# Patient Record
Sex: Female | Born: 2000 | Race: Black or African American | Hispanic: No | Marital: Single | Smoking: Never smoker
Health system: Southern US, Community
[De-identification: ages and names within clinical notes are randomized; demographics above are authoritative.]

## PROBLEM LIST (undated history)

## (undated) DIAGNOSIS — J45909 Unspecified asthma, uncomplicated: Secondary | ICD-10-CM

## (undated) DIAGNOSIS — N62 Hypertrophy of breast: Secondary | ICD-10-CM

## (undated) HISTORY — PX: WISDOM TOOTH EXTRACTION: SHX21

## (undated) HISTORY — PX: TONSILLECTOMY: SUR1361

---

## 2020-12-18 ENCOUNTER — Ambulatory Visit (INDEPENDENT_AMBULATORY_CARE_PROVIDER_SITE_OTHER): Payer: Medicaid Other

## 2020-12-18 ENCOUNTER — Telehealth: Payer: Self-pay | Admitting: Emergency Medicine

## 2020-12-18 ENCOUNTER — Ambulatory Visit
Admission: RE | Admit: 2020-12-18 | Discharge: 2020-12-18 | Disposition: A | Discharge status: Home / Self Care | Payer: Medicaid Other | Source: Ambulatory Visit

## 2020-12-18 ENCOUNTER — Other Ambulatory Visit: Payer: Self-pay

## 2020-12-18 VITALS — BP 114/78 | HR 101 | Temp 98.9°F | Resp 16

## 2020-12-18 DIAGNOSIS — M25531 Pain in right wrist: Secondary | ICD-10-CM

## 2020-12-18 DIAGNOSIS — S63501A Unspecified sprain of right wrist, initial encounter: Secondary | ICD-10-CM

## 2020-12-18 DIAGNOSIS — W19XXXA Unspecified fall, initial encounter: Secondary | ICD-10-CM | POA: Diagnosis not present

## 2020-12-18 MED ORDER — IBUPROFEN 600 MG PO TABS: 600.0000 mg | ORAL_TABLET | Freq: Four times a day (QID) | ORAL | 0 refills | Status: DC | PRN

## 2020-12-18 NOTE — ED Provider Notes (Signed)
[facility]    CSN: [id number] Arrival date & time: 12/18/20  1445      History   Chief Complaint Chief Complaint  Patient presents with  . Appointment    3:00  . Wrist Pain    HPI Rita Ross is a 20 y.o. female presenting today for evaluation of wrist pain.  Reports pain began 1 day ago.  Reports falling onto right wrist and since has had pain with movement.  Denies any numbness or tingling.  Denies prior fracture. HPI  History reviewed. No pertinent past medical history.  There are no problems to display for this patient.   History reviewed. No pertinent surgical history.  OB History   No obstetric history on file.      Home Medications    Prior to Admission medications   Medication Sig Start Date End Date Taking? Authorizing Provider  ibuprofen (ADVIL) 600 MG tablet Take 1 tablet (600 mg total) by mouth every 6 (six) hours as needed. 12/18/20  Yes Jairon Ripberger C, PA-C  SIMPESSE 0.15-0.03 &0.01 MG tablet Take 1 tablet by mouth daily. 12/04/20  Yes [provider]    Family History Family History  Problem Relation Age of Onset  . Healthy Mother   . Healthy Father     Social History Social History   Tobacco Use  . Smoking status: Never Smoker  . Smokeless tobacco: Never Used  Substance Use Topics  . Alcohol use: Never     Allergies   Amoxicillin   Review of Systems Review of Systems  Constitutional: Negative for fatigue and fever.  Eyes: Negative for visual disturbance.  Respiratory: Negative for shortness of breath.   Cardiovascular: Negative for chest pain.  Gastrointestinal: Negative for abdominal pain, nausea and vomiting.  Musculoskeletal: Positive for arthralgias and joint swelling.  Skin: Negative for color change, rash and wound.  Neurological: Negative for dizziness, weakness, light-headedness and headaches.     Physical Exam Triage Vital Signs ED Triage Vitals [12/18/20 1510]  Enc Vitals Group      BP      Pulse      Resp      Temp      Temp src      SpO2      Weight      Height      Head Circumference      Peak Flow      Pain Score 7     Pain Loc      Pain Edu?      Excl. in GC?    No data found.  Updated Vital Signs BP 114/78 (BP Location: Right Arm)   Pulse (!) 101   Temp 98.9 F (37.2 C) (Oral)   Resp 16   LMP 10/27/2020   SpO2 97%   Visual Acuity Right Eye Distance:   Left Eye Distance:   Bilateral Distance:    Right Eye Near:   Left Eye Near:    Bilateral Near:     Physical Exam Vitals and nursing note reviewed.  Constitutional:      Appearance: She is well-developed.     Comments: No acute distress  HENT:     Head: Normocephalic and atraumatic.     Nose: Nose normal.  Eyes:     Conjunctiva/sclera: Conjunctivae normal.  Cardiovascular:     Rate and Rhythm: Normal rate.  Pulmonary:     Effort: Pulmonary effort is normal. No respiratory distress.  Abdominal:  General: There is no distension.  Musculoskeletal:        General: Normal range of motion.     Cervical back: Neck supple.     Comments: Right wrist: No obvious deformity swelling or discoloration, tenderness to palpation of distal radius, nontender to distal ulna, no snuffbox tenderness, mild tenderness along first metacarpal, nontender throughout large portion of the dorsum of hand, radial pulse 2+ Full active range of motion of wrist/hand  Skin:    General: Skin is warm and dry.  Neurological:     Mental Status: She is alert and oriented to person, place, and time.      UC Treatments / Results  Labs (all labs ordered are listed, but only abnormal results are displayed) Labs Reviewed - No data to display  EKG   Radiology DG Wrist Complete Right  Result Date: 12/18/2020 CLINICAL DATA:  Right wrist pain after fall 2 days ago. EXAM: RIGHT WRIST - COMPLETE 3+ VIEW COMPARISON:  None. FINDINGS: There is no evidence of fracture or dislocation. There is no evidence of  arthropathy or other focal bone abnormality. Soft tissues are unremarkable. IMPRESSION: Negative. Electronically Signed   By: Lupita Raider M.D.   On: 12/18/2020 15:52    Procedures Procedures (including critical care time)  Medications Ordered in UC Medications - No data to display  Initial Impression / Assessment and Plan / UC Course  I have reviewed the triage vital signs and the nursing notes.  Pertinent labs & imaging results that were available during my care of the patient were reviewed by me and considered in my medical decision making (see chart for details).     X-ray negative for acute bony abnormality, no snuffbox tenderness, low suspicion of scaphoid injury, placing in wrist brace and recommending anti-inflammatories rest and ice.  Would expect spontaneous gradual resolution.  Continue to monitor,Discussed strict return precautions. Patient verbalized understanding and is agreeable with plan.  Final Clinical Impressions(s) / UC Diagnoses   Final diagnoses:  Sprain of right wrist, initial encounter     Discharge Instructions     Use anti-inflammatories for pain/swelling. You may take up to 800 mg Ibuprofen every 8 hours with food. You may supplement Ibuprofen with Tylenol [phone] mg every 8 hours.  Ice and rest wrist Wear wrist brace Follow-up if not improving or worsening    ED Prescriptions    Medication Sig Dispense Auth. Provider   ibuprofen (ADVIL) 600 MG tablet Take 1 tablet (600 mg total) by mouth every 6 (six) hours as needed. 30 tablet Antar Milks, Edmund C, PA-C     PDMP not reviewed this encounter.   Lew Dawes, PA-C 12/18/20 1606

## 2020-12-18 NOTE — ED Triage Notes (Signed)
Patient presents to Urgent Care with complaints of right wrist pain since 1 day ago. Patient reports falling onto the right wrist. Having pain with movement. Right hand dominant

## 2020-12-18 NOTE — Discharge Instructions (Signed)
Use anti-inflammatories for pain/swelling. You may take up to 800 mg Ibuprofen every 8 hours with food. You may supplement Ibuprofen with Tylenol [phone] mg every 8 hours.  Ice and rest wrist Wear wrist brace Follow-up if not improving or worsening

## 2021-06-21 ENCOUNTER — Ambulatory Visit
Admission: EM | Admit: 2021-06-21 | Discharge: 2021-06-21 | Disposition: A | Discharge status: Home / Self Care | Payer: Commercial Managed Care - PPO | Attending: Internal Medicine | Admitting: Internal Medicine

## 2021-06-21 ENCOUNTER — Other Ambulatory Visit: Payer: Self-pay

## 2021-06-21 ENCOUNTER — Encounter: Payer: Self-pay | Admitting: Emergency Medicine

## 2021-06-21 DIAGNOSIS — L03213 Periorbital cellulitis: Secondary | ICD-10-CM | POA: Diagnosis not present

## 2021-06-21 MED ORDER — CEFDINIR 300 MG PO CAPS: 300.0000 mg | ORAL_CAPSULE | Freq: Two times a day (BID) | ORAL | 0 refills | Status: AC

## 2021-06-21 NOTE — Discharge Instructions (Addendum)
You have preseptal cellulitis.  This is treated with cefdinir antibiotic.  This medication has been sent to your pharmacy.  Please follow-up if symptoms persist or do not improve.

## 2021-06-21 NOTE — ED Triage Notes (Signed)
Patient c/o right eyelid swelling x 2 days, no injury, some swelling.  Drainage from eye in the am.

## 2021-06-21 NOTE — ED Provider Notes (Signed)
[facility]    CSN: [id number] Arrival date & time: 06/21/21  1257      History   Chief Complaint Chief Complaint  Patient presents with   Belepharitis    HPI Rita Ross is a 20 y.o. female.   Patient presents with right eyelid swelling that has been present for approximately 2 days.  Denies any pain to eye but states that it is "irritating".  Denies any itchiness or drainage from the eye.  Denies any trauma to the eye or any foreign bodies.  Does endorse some mild blurry vision.  Patient was seen by PCP with telemetry visit yesterday and was diagnosed with hordeolum and prescribed erythromycin ointment.  Patient has not yet been taking erythromycin ointment.Denies pain with EOM.     History reviewed. No pertinent past medical history.  There are no problems to display for this patient.   History reviewed. No pertinent surgical history.  OB History   No obstetric history on file.      Home Medications    Prior to Admission medications   Medication Sig Start Date End Date Taking? Authorizing Provider  cefdinir (OMNICEF) 300 MG capsule Take 1 capsule (300 mg total) by mouth 2 (two) times daily for 10 days. 06/21/21 07/01/21 Yes Lance Muss, FNP  SIMPESSE 0.15-0.03 &0.01 MG tablet Take 1 tablet by mouth daily. 12/04/20  Yes [provider]  ibuprofen (ADVIL) 600 MG tablet Take 1 tablet (600 mg total) by mouth every 6 (six) hours as needed. 12/18/20   Wieters, Junius Creamer, PA-C    Family History Family History  Problem Relation Age of Onset   Healthy Mother    Healthy Father     Social History Social History   Tobacco Use   Smoking status: Never   Smokeless tobacco: Never  Substance Use Topics   Alcohol use: Never     Allergies   Amoxicillin   Review of Systems Review of Systems Per HPI  Physical Exam Triage Vital Signs ED Triage Vitals  Enc Vitals Group     BP 06/21/21 1431 123/81     Pulse Rate 06/21/21 1431 76      Resp 06/21/21 1431 18     Temp 06/21/21 1431 98 F (36.7 C)     Temp Source 06/21/21 1431 Oral     SpO2 06/21/21 1431 99 %     Weight 06/21/21 1432 164 lb (74.4 kg)     Height 06/21/21 1432 5\' 3"  (1.6 m)     Head Circumference --      Peak Flow --      Pain Score 06/21/21 1431 2     Pain Loc --      Pain Edu? --      Excl. in GC? --    No data found.  Updated Vital Signs BP 123/81 (BP Location: Left Arm)   Pulse 76   Temp 98 F (36.7 C) (Oral)   Resp 18   Ht 5\' 3"  (1.6 m)   Wt 164 lb (74.4 kg)   LMP 06/10/2021   SpO2 99%   BMI 29.05 kg/m   Visual Acuity Right Eye Distance: 20/25 Left Eye Distance: 20/20 Bilateral Distance: 20/20  Right Eye Near:   Left Eye Near:    Bilateral Near:     Physical Exam Constitutional:      General: She is not in acute distress.    Appearance: Normal appearance. She is not toxic-appearing or diaphoretic.  HENT:  Head: Normocephalic and atraumatic.  Eyes:     General: Lids are everted, no foreign bodies appreciated. Vision grossly intact. Gaze aligned appropriately.        Right eye: No foreign body, discharge or hordeolum.     Extraocular Movements: Extraocular movements intact.     Conjunctiva/sclera: Conjunctivae normal.     Pupils: Pupils are equal, round, and reactive to light.     Comments: erythema and moderate swelling to right upper eyelid.  No drainage noted.  Conjunctivae are normal.  Sclera normal.  Pulmonary:     Effort: Pulmonary effort is normal.  Neurological:     General: No focal deficit present.     Mental Status: She is alert and oriented to person, place, and time. Mental status is at baseline.  Psychiatric:        Mood and Affect: Mood normal.        Behavior: Behavior normal.        Thought Content: Thought content normal.        Judgment: Judgment normal.     UC Treatments / Results  Labs (all labs ordered are listed, but only abnormal results are displayed) Labs Reviewed - No data to  display  EKG   Radiology No results found.  Procedures Procedures (including critical care time)  Medications Ordered in UC Medications - No data to display  Initial Impression / Assessment and Plan / UC Course  I have reviewed the triage vital signs and the nursing notes.  Pertinent labs & imaging results that were available during my care of the patient were reviewed by me and considered in my medical decision making (see chart for details).     Physical exam seems consistent with preseptal cellulitis.  Will treat with cefdinir antibiotic.  Patient has penicillin allergy but is not sure about cephalosporins.  Will treat with cefdinir as this is best practice.  Patient advised to stop taking if reaction occurs.  Visual acuity normal in urgent care today.  Advised patient to follow-up if symptoms persist or worsen.Discussed strict return precautions. Patient verbalized understanding and is agreeable with plan.  Final Clinical Impressions(s) / UC Diagnoses   Final diagnoses:  Preseptal cellulitis of right eye     Discharge Instructions      You have preseptal cellulitis.  This is treated with cefdinir antibiotic.  This medication has been sent to your pharmacy.  Please follow-up if symptoms persist or do not improve.     ED Prescriptions     Medication Sig Dispense Auth. Provider   cefdinir (OMNICEF) 300 MG capsule Take 1 capsule (300 mg total) by mouth 2 (two) times daily for 10 days. 20 capsule Lance Muss, FNP      PDMP not reviewed this encounter.   Lance Muss, FNP 06/21/21 [phone]

## 2021-06-22 ENCOUNTER — Ambulatory Visit: Payer: Self-pay

## 2022-02-13 ENCOUNTER — Ambulatory Visit (INDEPENDENT_AMBULATORY_CARE_PROVIDER_SITE_OTHER): Payer: Medicaid Other | Admitting: Plastic Surgery

## 2022-02-13 ENCOUNTER — Encounter: Payer: Self-pay | Admitting: Plastic Surgery

## 2022-02-13 VITALS — BP 123/78 | HR 90 | Ht 65.0 in | Wt 170.6 lb

## 2022-02-13 DIAGNOSIS — N62 Hypertrophy of breast: Secondary | ICD-10-CM

## 2022-02-13 DIAGNOSIS — M4004 Postural kyphosis, thoracic region: Secondary | ICD-10-CM | POA: Diagnosis not present

## 2022-02-13 DIAGNOSIS — M542 Cervicalgia: Secondary | ICD-10-CM | POA: Diagnosis not present

## 2022-02-13 DIAGNOSIS — M546 Pain in thoracic spine: Secondary | ICD-10-CM

## 2022-02-13 DIAGNOSIS — M545 Low back pain, unspecified: Secondary | ICD-10-CM

## 2022-02-13 DIAGNOSIS — N6481 Ptosis of breast: Secondary | ICD-10-CM

## 2022-02-13 DIAGNOSIS — R21 Rash and other nonspecific skin eruption: Secondary | ICD-10-CM

## 2022-02-13 NOTE — Progress Notes (Signed)
Referring Provider Tommy Medal, NP [address] [place],  [place] [address]   CC:  Chief Complaint  Patient presents with   Advice Only      Rita Ross is an 21 y.o. female.  HPI: Patient presents to discuss breast reduction.  She has had years of back pain, neck pain and shoulder pain with related to her large breast.  She tried over-the-counter medications, warm packs, cold packs and supportive bras with little relief.  She also gets rashes beneath her breast that been refractory to over-the-counter treatments.  She is currently and I cup and would like to be a C or D cup.  She does not smoke and is not diabetic.  No previous breast biopsies or procedures.  She has been to a chiropractor with little relief of her back pain.  Allergies  Allergen Reactions   Amoxicillin Hives    Drowsiness   American Cockroach     Reaction unknown.   Dust Mite Extract Other (See Comments)    Pressure in ears, stuffy nose and mucus drip in throat    Molds & Smuts     Outpatient Encounter Medications as of 02/13/2022  Medication Sig   azelastine (ASTELIN) 0.1 % nasal spray SMARTSIG:2 Spray(s) Both Nares Twice Daily PRN   cetirizine (ZYRTEC) 10 MG tablet Take 10 mg by mouth daily.   fluticasone (FLONASE) 50 MCG/ACT nasal spray Administer 1 spray into each nostril Once Daily.   hydrocortisone 2.5 % ointment Apply topically.   SIMPESSE 0.15-0.03 &0.01 MG tablet Take 1 tablet by mouth daily.   [DISCONTINUED] ibuprofen (ADVIL) 600 MG tablet Take 1 tablet (600 mg total) by mouth every 6 (six) hours as needed.   No facility-administered encounter medications on file as of 02/13/2022.     No past medical history on file.  No past surgical history on file.  Family History  Problem Relation Age of Onset   Healthy Mother    Healthy Father     Social History   Social History Narrative   Not on file     Review of Systems General: Denies fevers, chills, weight loss CV:  Denies chest pain, shortness of breath, palpitations  Physical Exam    02/13/2022    3:49 PM 06/21/2021    2:32 PM 06/21/2021    2:31 PM  Vitals with BMI  Height 5\' 5"  5\' 3"    Weight 170 lbs 10 oz 164 lbs   BMI 28.39 29.06   Systolic 123  123  Diastolic 78  81  Pulse 90  76    General:  No acute distress,  Alert and oriented, Non-Toxic, Normal speech and affect Breast: She has grade 3 ptosis.  Sternal notch to nipple is 34 cm bilaterally.  Nipple to fold is 20 cm bilaterally.  No obvious scars or masses.  Assessment/Plan The patient has bilateral symptomatic macromastia.  She is a good candidate for a breast reduction.  She is interested in pursuing surgical treatment.  She has tried supportive garments and fitted bras with no relief.  The details of breast reduction surgery were discussed.  I explained the procedure in detail along the with the expected scars.  The risks were discussed in detail and include bleeding, infection, damage to surrounding structures, need for additional procedures, nipple loss, change in nipple sensation, persistent pain, contour irregularities and asymmetries.  I explained that breast feeding is often not possible after breast reduction surgery.  We discussed the expected postoperative course  with an overall recovery period of about 1 month.  She demonstrated full understanding of all risks.  We discussed her personal risk factors.  The patient is interested in pursuing surgical treatment.  I anticipate approximately 500g of tissue removed from each side.   Rita Ross 02/13/2022, 3:58 PM

## 2022-02-21 ENCOUNTER — Telehealth: Payer: Self-pay | Admitting: Plastic Surgery

## 2022-02-21 NOTE — Telephone Encounter (Signed)
Patient called to follow up on record request made by this office; called to see if records received. Please advise at [phone].

## 2022-02-21 NOTE — Telephone Encounter (Signed)
Returned patients call. Advised her we received [place] notes, but not the PCP.

## 2022-02-26 ENCOUNTER — Telehealth: Payer: Self-pay

## 2022-02-26 NOTE — Telephone Encounter (Signed)
Called Dr. Tommy Medal office, spoke with Fayrene Fearing. Advised we only received pages 37, 38, 39 out of 39 pages. Was to medical records [phone]. Spoke with Elease Hashimoto and will be refaxing 1-36 pages.

## 2022-03-04 ENCOUNTER — Telehealth: Payer: Self-pay

## 2022-03-06 ENCOUNTER — Telehealth: Payer: Self-pay

## 2022-03-06 NOTE — Telephone Encounter (Signed)
Called patient, LMVM. Still waiting on 6 weeks of PT or Chiropractor notes. Can submit with out if she would like [place] to.

## 2022-03-11 ENCOUNTER — Telehealth: Payer: Self-pay | Admitting: Plastic Surgery

## 2022-03-11 NOTE — Telephone Encounter (Signed)
Authorization initiated through [facility] [employer]. Clinicals faxed to [phone]. Confirmation # [id number]

## 2022-04-02 ENCOUNTER — Telehealth: Payer: Self-pay | Admitting: Surgical

## 2022-04-02 NOTE — Telephone Encounter (Signed)
Left message to return call to f/u on recent consultation; need to schedule consult with one of our providers at n/c to the patient.

## 2022-04-02 NOTE — Telephone Encounter (Signed)
Patient returned call. Consult (n/c) scheduled with Dr. Domenica Reamer on 05/20/22 at 11:15am.

## 2022-04-12 IMAGING — DX DG WRIST COMPLETE 3+V*R*
4 series · 4 of 4 positions shown · non-contrast
Comparison: None.

CLINICAL DATA: Right wrist pain after fall 2 days ago.

EXAM:
RIGHT WRIST - COMPLETE 3+ VIEW

[wrist pa (1 of 3)]
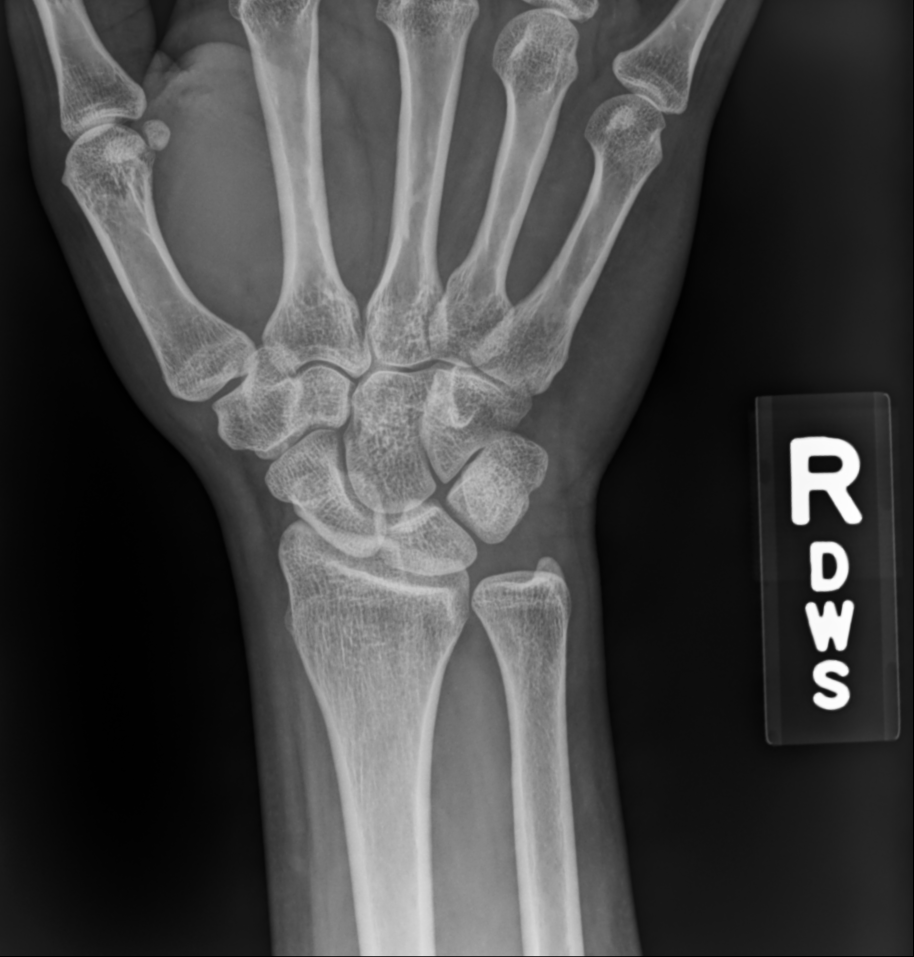

[wrist pa (2 of 3)]
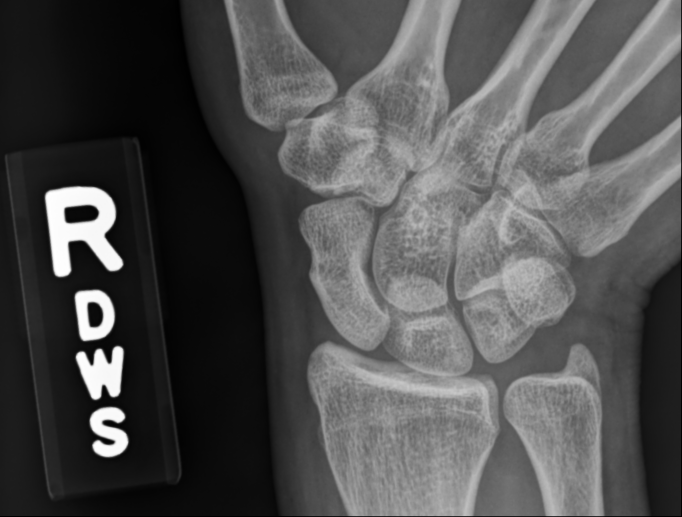

[wrist pa (3 of 3)]
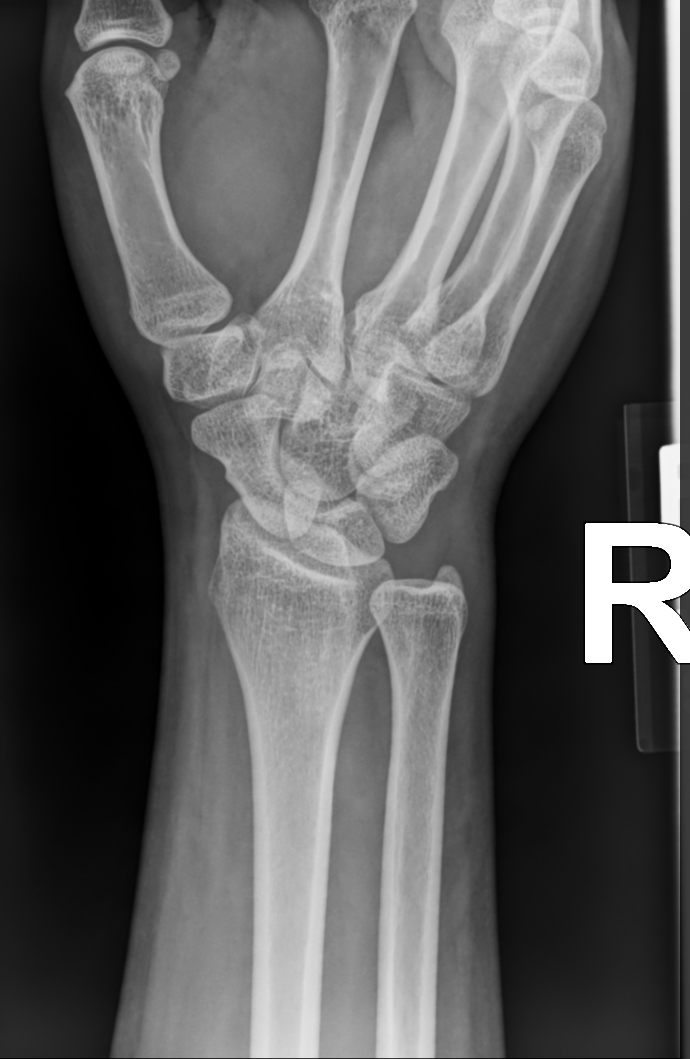

[wrist lat]
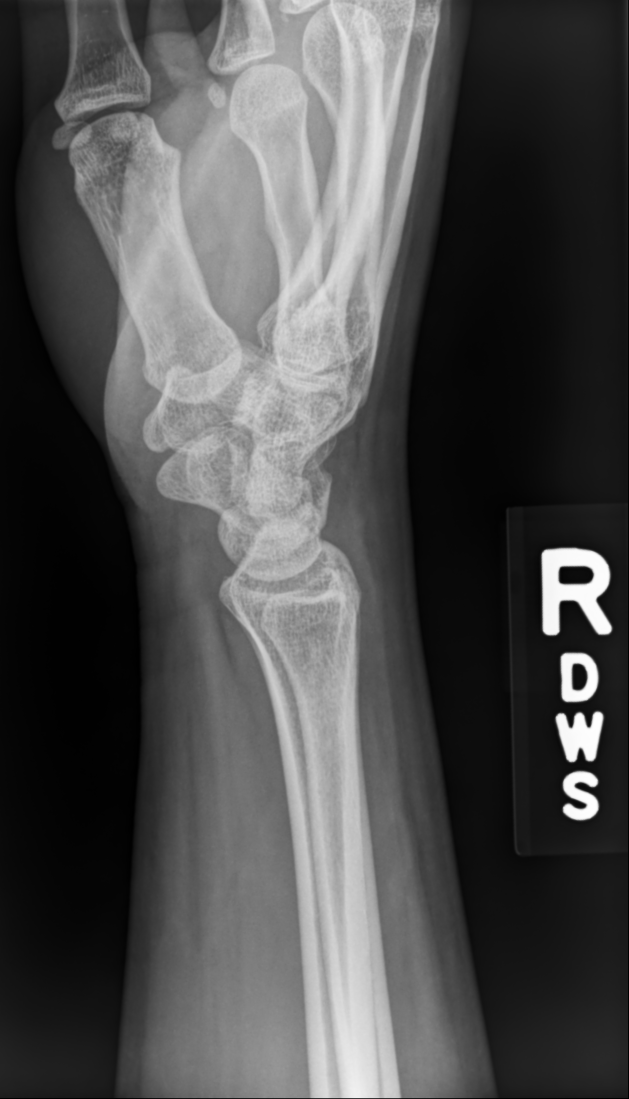

[4 of 4 positions shown; findings below may reference images not displayed]

FINDINGS: There is no evidence of fracture or dislocation. There is no
evidence of arthropathy or other focal bone abnormality. Soft
tissues are unremarkable.
IMPRESSION: Negative.

## 2022-05-20 ENCOUNTER — Ambulatory Visit (INDEPENDENT_AMBULATORY_CARE_PROVIDER_SITE_OTHER): Payer: Medicaid Other | Admitting: Plastic Surgery

## 2022-05-20 ENCOUNTER — Encounter: Payer: Self-pay | Admitting: Plastic Surgery

## 2022-05-20 VITALS — BP 128/84 | HR 79 | Ht 63.0 in | Wt 163.0 lb

## 2022-05-20 DIAGNOSIS — M546 Pain in thoracic spine: Secondary | ICD-10-CM

## 2022-05-20 DIAGNOSIS — M545 Low back pain, unspecified: Secondary | ICD-10-CM | POA: Diagnosis not present

## 2022-05-20 DIAGNOSIS — R21 Rash and other nonspecific skin eruption: Secondary | ICD-10-CM

## 2022-05-20 DIAGNOSIS — Z6828 Body mass index (BMI) 28.0-28.9, adult: Secondary | ICD-10-CM | POA: Diagnosis not present

## 2022-05-20 DIAGNOSIS — N62 Hypertrophy of breast: Secondary | ICD-10-CM | POA: Diagnosis not present

## 2022-05-20 NOTE — Progress Notes (Signed)
   Referring Provider No referring provider defined for this encounter.   CC:  Breast hypertrophy and back pain.  Rita Ross is an 21 y.o. female.  HPI: Patient is a 21 year old female who is interested in bilateral breast reduction.  She continues to have breast hypertrophy and back pain.  She is due a chiropractor with little relief of back pain.  She has not had mammograms due to age.  She does not use tobacco.  She has been previously seen in this practice by Dr. Arita Miss.  She continues to have significant rashes.  She would like to be a C or D cup and is currently and I cup.  She is not diabetic.  No mammogram due to age.  Review of Systems General: No fever chills  Physical Exam    05/20/2022   11:27 AM 02/13/2022    3:49 PM 06/21/2021    2:32 PM  Vitals with BMI  Height 5\' 3"  5\' 5"  5\' 3"   Weight 163 lbs 170 lbs 10 oz 164 lbs  BMI 28.88 28.39 29.06  Systolic 128 123   Diastolic 84 78   Pulse 79 90     General:  No acute distress,  Alert and oriented, Non-Toxic, Normal speech and affect Breast: Sternal notch to nipple 34 on the right and 35 in the left, base width 18 bilaterally, nipple to fold 17 bilaterally.  All measurements are in centimeters.  Patient has significant breast ptosis.  Assessment/Plan Patient is a good candidate for breast reduction to improve her rashes and her back pain.  I estimate we can remove greater than 500 g of breast tissue on each side.  Risks and benefits including wound problems, back pain, inability to breast-feed were discussed.  We discussed that minor wound problems are common but major wound problems or nipple loss are much less common.  05/20/2022, 1:17 PM

## 2022-06-13 ENCOUNTER — Encounter: Payer: Self-pay | Admitting: Plastic Surgery

## 2022-06-13 ENCOUNTER — Ambulatory Visit (INDEPENDENT_AMBULATORY_CARE_PROVIDER_SITE_OTHER): Payer: Medicaid Other | Admitting: Plastic Surgery

## 2022-06-13 VITALS — BP 111/74 | HR 84 | Ht 60.0 in | Wt 161.0 lb

## 2022-06-13 DIAGNOSIS — N62 Hypertrophy of breast: Secondary | ICD-10-CM | POA: Diagnosis not present

## 2022-06-13 NOTE — Progress Notes (Signed)
Referring Provider No referring provider defined for this encounter.   CC:  Chief Complaint  Patient presents with   Consult           Rita Ross is an 21 y.o. female.  HPI: Rita Ross is a 21 year old female who has been seen several times in our clinic for discussion of a bilateral breast reduction.  She was felt at both previous appointments to be a good candidate and I agree with this assessment.  She is anxious to proceed with surgery and does not have any questions about the procedure at this time.  Allergies  Allergen Reactions   Amoxicillin Hives    Drowsiness   American Cockroach     Reaction unknown.   Dust Mite Extract Other (See Comments)    Pressure in ears, stuffy nose and mucus drip in throat    Molds & Smuts     Outpatient Encounter Medications as of 06/13/2022  Medication Sig   azelastine (ASTELIN) 0.1 % nasal spray SMARTSIG:2 Spray(s) Both Nares Twice Daily PRN   cetirizine (ZYRTEC) 10 MG tablet Take 10 mg by mouth daily.   hydrocortisone 2.5 % ointment Apply topically.   SIMPESSE 0.15-0.03 &0.01 MG tablet Take 1 tablet by mouth daily.   fluticasone (FLONASE) 50 MCG/ACT nasal spray Administer 1 spray into each nostril Once Daily.   No facility-administered encounter medications on file as of 06/13/2022.     No past medical history on file.  No past surgical history on file.  Family History  Problem Relation Age of Onset   Healthy Mother    Healthy Father     Social History   Social History Narrative   Not on file     Review of Systems General: Denies fevers, chills, weight loss CV: Denies chest pain, shortness of breath, palpitations Review of systems is unremarkable  Physical Exam    06/13/2022    9:58 AM 05/20/2022   11:27 AM 02/13/2022    3:49 PM  Vitals with BMI  Height 5\' 0"  5\' 3"  5\' 5"   Weight 161 lbs 163 lbs 170 lbs 10 oz  BMI 31.44 28.88 28.39  Systolic 111 128  Diastolic 74 84 78  Pulse 84 79 90     General:  No acute distress,  Alert and oriented, Non-Toxic, Normal speech and affect Breasts: Measurements as previously taken were confirmed nipple to sternal notch distance was 4 cm on the right and 35 cm on the left.  She had no dominant masses and no particular abnormalities including nipple discharge that I noted.  Assessment/Plan Symptomatic macromastia: I agree the patient is a good candidate for a bilateral breast reduction. Once again we discussed the procedure.  I discussed with her the location of the incisions the unpredictable nature of wound healing including the increased risk of hypertrophic scarring and keloid formation in patients with darker skin.  We discussed the usual surgical risks of bleeding infection and seroma formation all of which are reasonably small risks and of this particular surgery.  We did discuss the risk of nipple loss due to ischemia, change of feeling in the nipples after the surgery, the possibility of difficulty with breast-feeding after the surgery.  I told her that we would likely use drains at the time of surgery, we discussed the importance of compression and breast support after surgery, and the importance of early ambulation to prevent pulmonary complications and DVT.  She verbalized understanding of all of this I answered any  additional questions that she had and she request that I proceed with scheduling surgery.  Rita Ross 06/13/2022, 12:25 PM

## 2022-06-28 ENCOUNTER — Ambulatory Visit
Admission: RE | Admit: 2022-06-28 | Discharge: 2022-06-28 | Disposition: A | Discharge status: Home / Self Care | Payer: Medicaid Other | Source: Ambulatory Visit | Attending: Emergency Medicine | Admitting: Emergency Medicine

## 2022-06-28 VITALS — BP 111/75 | HR 76 | Temp 98.0°F | Resp 16

## 2022-06-28 DIAGNOSIS — Z7721 Contact with and (suspected) exposure to potentially hazardous body fluids: Secondary | ICD-10-CM

## 2022-06-28 DIAGNOSIS — S61031A Puncture wound without foreign body of right thumb without damage to nail, initial encounter: Secondary | ICD-10-CM | POA: Diagnosis not present

## 2022-06-28 NOTE — ED Triage Notes (Signed)
The patient states she obtained a puncture wound to her right thumb yesterday. The patient states she has not had any signs of infection to the area.

## 2022-06-28 NOTE — Discharge Instructions (Signed)
The blood tests we did today included an HIV screening and a hepatitis A, B and C screening.  Because its been less than 24 hours since your puncture wound occurred, if you are exposed to any of these diseases you would not have had time to produce antibodies to them.  I anticipate that these tests today will all be negative.  I recommend that she return in 3 to 4 weeks to repeat these test or contact your primary care provider's office to repeat them.  If you show any signs of antibodies any of these diseases with the second blood test, then you will know that you have been exposed and treatment will be recommended for you.  Thank you for visiting urgent care today.

## 2022-06-28 NOTE — ED Provider Notes (Signed)
[facility]    CSN: [id number] Arrival date & time: 06/28/22  1011    HISTORY   Chief Complaint  Patient presents with   Puncture Wound    Entered by patient   HPI Rita Ross is a pleasant, 21 y.o. female who presents to urgent care today. The patient states she obtained a puncture wound to her right thumb yesterday. The patient states she has not had any signs of infection to the area.  Patient states she thinks it was a clothing since her tag but is not sure.  Patient states she obtained the wound while cleaning out her now deceased mother's things, patient states mother was an IV drug abuser.  Patient states she is reasonably certain that she was not stuck by a used needle but not 100%.  Patient states her primary care provider's triage nurse advised her to come to urgent care for further evaluation.  Patient states her last tetanus booster was September 26 of this year.  The history is provided by the patient.   History reviewed. No pertinent past medical history. There are no problems to display for this patient.  History reviewed. No pertinent surgical history. OB History   No obstetric history on file.    Home Medications    Prior to Admission medications   Medication Sig Start Date End Date Taking? Authorizing Provider  azelastine (ASTELIN) 0.1 % nasal spray SMARTSIG:2 Spray(s) Both Nares Twice Daily PRN 01/02/22   [provider]  cetirizine (ZYRTEC) 10 MG tablet Take 10 mg by mouth daily. 01/02/22   [provider]  fluticasone (FLONASE) 50 MCG/ACT nasal spray Administer 1 spray into each nostril Once Daily. 06/04/21 06/04/22  [provider]  hydrocortisone 2.5 % ointment Apply topically. 07/17/21   [provider]  SIMPESSE 0.15-0.03 &0.01 MG tablet Take 1 tablet by mouth daily. 12/04/20   [provider]    Family History Family History  Problem Relation Age of Onset   Healthy Mother    Healthy  Father    Social History Social History   Tobacco Use   Smoking status: Never   Smokeless tobacco: Never  Substance Use Topics   Alcohol use: Never   Allergies   Amoxicillin, American cockroach, Dust mite extract, and Molds & smuts  Review of Systems Review of Systems Pertinent findings revealed after performing a 14 point review of systems has been noted in the history of present illness.  Physical Exam Triage Vital Signs ED Triage Vitals  Enc Vitals Group     BP 07/06/21 0827 (!) 147/82     Pulse Rate 07/06/21 0827 72     Resp 07/06/21 0827 18     Temp 07/06/21 0827 98.3 F (36.8 C)     Temp Source 07/06/21 0827 Oral     SpO2 07/06/21 0827 98 %     Weight --      Height --      Head Circumference --      Peak Flow --      Pain Score 07/06/21 0826 5     Pain Loc --      Pain Edu? --      Excl. in GC? --   No data found.  Updated Vital Signs BP 111/75 (BP Location: Left Arm)   Pulse 76   Temp 98 F (36.7 C) (Oral)   Resp 16   LMP 06/04/2022   SpO2 98%   Physical Exam Vitals and nursing note reviewed.  Constitutional:      General: She is not in acute distress.    Appearance: Normal appearance.  HENT:     Head: Normocephalic and atraumatic.  Eyes:     Pupils: Pupils are equal, round, and reactive to light.  Cardiovascular:     Rate and Rhythm: Normal rate and regular rhythm.  Pulmonary:     Effort: Pulmonary effort is normal.     Breath sounds: Normal breath sounds.  Musculoskeletal:        General: Normal range of motion.     Cervical back: Normal range of motion and neck supple.  Skin:    General: Skin is warm and dry.     Findings: Lesion (Small puncture wound of fatty aspect of right thumb without surrounding erythema, tenderness, induration or drainage.) present.  Neurological:     General: No focal deficit present.     Mental Status: She is alert and oriented to person, place, and time. Mental status is at baseline.  Psychiatric:         Mood and Affect: Mood normal.        Behavior: Behavior normal.        Thought Content: Thought content normal.        Judgment: Judgment normal.     Visual Acuity Right Eye Distance:   Left Eye Distance:   Bilateral Distance:    Right Eye Near:   Left Eye Near:    Bilateral Near:     UC Couse / Diagnostics / Procedures:     Radiology No results found.  Procedures Procedures (including critical care time) EKG  Pending results:  Labs Reviewed  HEPATITIS PANEL, ACUTE  HIV ANTIBODY (ROUTINE TESTING W REFLEX)    Medications Ordered in UC: Medications - No data to display  UC Diagnoses / Final Clinical Impressions(s)   I have reviewed the triage vital signs and the nursing notes.  Pertinent labs & imaging results that were available during my care of the patient were reviewed by me and considered in my medical decision making (see chart for details).    Final diagnoses:  Puncture wound of right thumb, initial encounter  History of potentially hazardous body fluid exposure   Baseline labs performed today for HIV and hepatitis A, B, C.  Patient advised to have labs repeated in 3 to 4 weeks to look for antibodies.  Patient advised that if present, she will require further evaluation and possible treatment.  ED Prescriptions   None    PDMP not reviewed this encounter.  Pending results:  Labs Reviewed  HEPATITIS PANEL, ACUTE  HIV ANTIBODY (ROUTINE TESTING W REFLEX)    Discharge Instructions:   Discharge Instructions      The blood tests we did today included an HIV screening and a hepatitis A, B and C screening.  Because its been less than 24 hours since your puncture wound occurred, if you are exposed to any of these diseases you would not have had time to produce antibodies to them.  I anticipate that these tests today will all be negative.  I recommend that she return in 3 to 4 weeks to repeat these test or contact your primary care provider's office to  repeat them.  If you show any signs of antibodies any of these diseases with the second blood test, then you will know that you have been exposed and treatment will be recommended for you.  Thank you for visiting urgent care today.  Disposition Upon Discharge:  Condition: stable for discharge home  Patient presented with an acute illness with associated systemic symptoms and significant discomfort requiring urgent management. In my opinion, this is a condition that a prudent lay person (someone who possesses an average knowledge of health and medicine) may potentially expect to result in complications if not addressed urgently such as respiratory distress, impairment of bodily function or dysfunction of bodily organs.   Routine symptom specific, illness specific and/or disease specific instructions were discussed with the patient and/or caregiver at length.   As such, the patient has been evaluated and assessed, work-up was performed and treatment was provided in alignment with urgent care protocols and evidence based medicine.  Patient/parent/caregiver has been advised that the patient may require follow up for further testing and treatment if the symptoms continue in spite of treatment, as clinically indicated and appropriate.  Patient/parent/caregiver has been advised to return to the [facility] or PCP if no better; to PCP or the Emergency Department if new signs and symptoms develop, or if the current signs or symptoms continue to change or worsen for further workup, evaluation and treatment as clinically indicated and appropriate  The patient will follow up with their current PCP if and as advised. If the patient does not currently have a PCP we will assist them in obtaining one.   The patient may need specialty follow up if the symptoms continue, in spite of conservative treatment and management, for further workup, evaluation, consultation and treatment as clinically indicated and  appropriate.   Patient/parent/caregiver verbalized understanding and agreement of plan as discussed.  All questions were addressed during visit.  Please see discharge instructions below for further details of plan.  This office note has been dictated using Teaching laboratory technician.  Unfortunately, this method of dictation can sometimes lead to typographical or grammatical errors.  I apologize for your inconvenience in advance if this occurs.  Please do not hesitate to reach out to me if clarification is needed.      Theadora Rama Scales, PA-C 06/28/22 1251

## 2022-06-29 LAB — HIV ANTIBODY (ROUTINE TESTING W REFLEX): HIV Screen 4th Generation wRfx: NONREACTIVE

## 2022-07-02 ENCOUNTER — Telehealth: Payer: Self-pay | Admitting: *Deleted

## 2022-07-02 NOTE — Telephone Encounter (Signed)
Auth submitted via [place] [employer] for CPT [id number] 2 units - Pending

## 2022-07-16 ENCOUNTER — Telehealth: Payer: Self-pay | Admitting: *Deleted

## 2022-07-16 NOTE — Telephone Encounter (Signed)
Auth request voided by [place] [employer], MyChart message sent to patient.

## 2022-08-04 ENCOUNTER — Ambulatory Visit
Admission: RE | Admit: 2022-08-04 | Discharge: 2022-08-04 | Disposition: A | Discharge status: Home / Self Care | Payer: Medicaid Other | Source: Ambulatory Visit | Attending: Physician Assistant | Admitting: Physician Assistant

## 2022-08-04 ENCOUNTER — Other Ambulatory Visit: Payer: Self-pay

## 2022-08-04 VITALS — BP 134/81 | HR 80 | Temp 98.9°F | Resp 18

## 2022-08-04 DIAGNOSIS — J029 Acute pharyngitis, unspecified: Secondary | ICD-10-CM

## 2022-08-04 DIAGNOSIS — Z7721 Contact with and (suspected) exposure to potentially hazardous body fluids: Secondary | ICD-10-CM

## 2022-08-04 MED ORDER — PREDNISONE 20 MG PO TABS: 40.0000 mg | ORAL_TABLET | Freq: Every day | ORAL | 0 refills | Status: AC

## 2022-08-04 MED ORDER — LIDOCAINE VISCOUS HCL 2 % MT SOLN: 5.0000 mL | Freq: Three times a day (TID) | OROMUCOSAL | 0 refills | Status: DC | PRN

## 2022-08-04 NOTE — ED Provider Notes (Signed)
[facility]    CSN: [id number] Arrival date & time: 08/04/22  1347      History   Chief Complaint Chief Complaint  Patient presents with   Follow-up    Entered by patient   Sore Throat    HPI Rita Ross is a 21 y.o. female.   Patient here today for evaluation of sore throat she has had for the last week. She reports that she thinks she may have ulceration on her tonsil. She has not had fever. She denies any nausea, vomiting or diarrhea. She has had minimal congestion and cough.   She also needs repeat labs from prior possible needle stick.   The history is provided by the patient.  Sore Throat Pertinent negatives include no abdominal pain and no shortness of breath.    History reviewed. No pertinent past medical history.  There are no problems to display for this patient.   History reviewed. No pertinent surgical history.  OB History   No obstetric history on file.      Home Medications    Prior to Admission medications   Medication Sig Start Date End Date Taking? Authorizing Provider  magic mouthwash (lidocaine, diphenhydrAMINE, alum & mag hydroxide) suspension Swish and spit 5 mLs 3 (three) times daily as needed for mouth pain. 08/04/22  Yes Tomi Bamberger, PA-C  predniSONE (DELTASONE) 20 MG tablet Take 2 tablets (40 mg total) by mouth daily with breakfast for 5 days. 08/04/22 08/09/22 Yes Tomi Bamberger, PA-C  azelastine (ASTELIN) 0.1 % nasal spray SMARTSIG:2 Spray(s) Both Nares Twice Daily PRN 01/02/22   [provider]  cetirizine (ZYRTEC) 10 MG tablet Take 10 mg by mouth daily. 01/02/22   [provider]  fluticasone (FLONASE) 50 MCG/ACT nasal spray Administer 1 spray into each nostril Once Daily. 06/04/21 06/04/22  [provider]  hydrocortisone 2.5 % ointment Apply topically. 07/17/21   [provider]  SIMPESSE 0.15-0.03 &0.01 MG tablet Take 1 tablet by mouth daily. 12/04/20   [provider]    Family History Family History  Problem Relation Age of Onset   Healthy Mother    Healthy Father     Social History Social History   Tobacco Use   Smoking status: Never   Smokeless tobacco: Never  Substance Use Topics   Alcohol use: Never     Allergies   Amoxicillin, American cockroach, Dust mite extract, and Molds & smuts   Review of Systems Review of Systems  Constitutional:  Negative for chills and fever.  HENT:  Positive for congestion and sore throat.   Eyes:  Negative for discharge and redness.  Respiratory:  Positive for cough. Negative for shortness of breath.   Gastrointestinal:  Negative for abdominal pain, diarrhea, nausea and vomiting.     Physical Exam Triage Vital Signs ED Triage Vitals [08/04/22 1427]  Enc Vitals Group     BP 134/81     Pulse Rate 80     Resp 18     Temp 98.9 F (37.2 C)     Temp Source Oral     SpO2 98 %     Weight      Height      Head Circumference      Peak Flow      Pain Score 2     Pain Loc      Pain Edu?      Excl. in GC?    No data found.  Updated Vital  Signs BP 134/81 (BP Location: Left Arm)   Pulse 80   Temp 98.9 F (37.2 C) (Oral)   Resp 18   SpO2 98%     Physical Exam Vitals and nursing note reviewed.  Constitutional:      General: She is not in acute distress.    Appearance: Normal appearance. She is not ill-appearing.  HENT:     Head: Normocephalic and atraumatic.     Nose: Nose normal. No congestion or rhinorrhea.     Mouth/Throat:     Mouth: Mucous membranes are moist.     Pharynx: Oropharynx is clear. Posterior oropharyngeal erythema present. No oropharyngeal exudate.     Comments: Approx 1 cm superficial ulceration to right oropharynx Eyes:     Conjunctiva/sclera: Conjunctivae normal.  Cardiovascular:     Rate and Rhythm: Normal rate and regular rhythm.  Pulmonary:     Effort: Pulmonary effort is normal. No respiratory distress.     Breath sounds: Normal breath sounds. No  wheezing, rhonchi or rales.  Neurological:     Mental Status: She is alert.  Psychiatric:        Mood and Affect: Mood normal.        Behavior: Behavior normal.        Thought Content: Thought content normal.      UC Treatments / Results  Labs (all labs ordered are listed, but only abnormal results are displayed) Labs Reviewed  HEPATITIS PANEL, ACUTE  HIV ANTIBODY (ROUTINE TESTING W REFLEX)    EKG   Radiology No results found.  Procedures Procedures (including critical care time)  Medications Ordered in UC Medications - No data to display  Initial Impression / Assessment and Plan / UC Course  I have reviewed the triage vital signs and the nursing notes.  Pertinent labs & imaging results that were available during my care of the patient were reviewed by me and considered in my medical decision making (see chart for details).    Steroid burst prescribed for treatment of pharyngitis.  Magic mouthwash prescribed given ulceration of mouth.  Discussed likely benign nature of same but encouraged follow-up if symptoms do not improve or worsen.  Blood-borne pathogen labs repeated.  Will await results further recommendation.  Final Clinical Impressions(s) / UC Diagnoses   Final diagnoses:  History of potentially hazardous body fluid exposure  Acute pharyngitis, unspecified etiology   Discharge Instructions   None    ED Prescriptions     Medication Sig Dispense Auth. Provider   predniSONE (DELTASONE) 20 MG tablet Take 2 tablets (40 mg total) by mouth daily with breakfast for 5 days. 10 tablet Erma Pinto F, PA-C   magic mouthwash (lidocaine, diphenhydrAMINE, alum & mag hydroxide) suspension Swish and spit 5 mLs 3 (three) times daily as needed for mouth pain. 360 mL Tomi Bamberger, PA-C      PDMP not reviewed this encounter.   Tomi Bamberger, PA-C 08/04/22 1512

## 2022-08-04 NOTE — ED Triage Notes (Signed)
Pt here for sore throat x 1 week; pt sts possible ulceration to tonsil; pt also needs follow up blood work from IV drug needle exposure

## 2022-08-07 LAB — HIV ANTIBODY (ROUTINE TESTING W REFLEX): HIV Screen 4th Generation wRfx: NONREACTIVE

## 2022-08-09 LAB — ACUTE VIRAL HEPATITIS (HAV, HBV, HCV)
HCV Ab: NONREACTIVE
Hep A IgM: NEGATIVE
Hep B C IgM: NEGATIVE
Hepatitis B Surface Ag: NEGATIVE

## 2022-08-09 LAB — HCV INTERPRETATION

## 2022-08-09 LAB — SPECIMEN STATUS REPORT

## 2022-11-27 ENCOUNTER — Ambulatory Visit
Admission: EM | Admit: 2022-11-27 | Discharge: 2022-11-27 | Disposition: A | Discharge status: Home / Self Care | Payer: Medicaid Other | Attending: Urgent Care | Admitting: Urgent Care

## 2022-11-27 ENCOUNTER — Ambulatory Visit (INDEPENDENT_AMBULATORY_CARE_PROVIDER_SITE_OTHER): Payer: Medicaid Other

## 2022-11-27 ENCOUNTER — Ambulatory Visit: Payer: Self-pay

## 2022-11-27 DIAGNOSIS — S60052A Contusion of left little finger without damage to nail, initial encounter: Secondary | ICD-10-CM

## 2022-11-27 DIAGNOSIS — M79645 Pain in left finger(s): Secondary | ICD-10-CM

## 2022-11-27 NOTE — ED Triage Notes (Signed)
Pt states left pinky pain denies injury.  Has not been doing anything at home for the pain.

## 2022-11-27 NOTE — Discharge Instructions (Signed)
Use the buddy tape system the next 1-2 weeks. Depending on how dirty the [place] gets, you would change it 3-5 times daily. Use ibuprofen for pain and inflammation at a dose of 400mg -600mg  every 8 hours with food as needed. Remember to do icing for 20 minutes after a hard day's work using your hand.

## 2022-11-27 NOTE — ED Provider Notes (Signed)
[facility]  Note:  This document was prepared using Systems analyst and may include unintentional dictation errors.  MRN: [id number] DOB: [birth year]  Subjective:   Rita Ross is a 22 y.o. female presenting for several day history of persistent left fifth finger pain.  Denies any bruising, swelling.  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  She can recall a few instances in which she banged her hand against random items.  No severe trauma though.  There is a family history of arthritis.  Has not used any medications for her symptoms.  No current facility-administered medications for this encounter.  Current Outpatient Medications:    azelastine (ASTELIN) 0.1 % nasal spray, SMARTSIG:2 Spray(s) Both Nares Twice Daily PRN, Disp: , Rfl:    cetirizine (ZYRTEC) 10 MG tablet, Take 10 mg by mouth daily., Disp: , Rfl:    fluticasone (FLONASE) 50 MCG/ACT nasal spray, Administer 1 spray into each nostril Once Daily., Disp: , Rfl:    hydrocortisone 2.5 % ointment, Apply topically., Disp: , Rfl:    magic mouthwash (lidocaine, diphenhydrAMINE, alum & mag hydroxide) suspension, Swish and spit 5 mLs 3 (three) times daily as needed for mouth pain., Disp: 360 mL, Rfl: 0   SIMPESSE 0.15-0.03 &0.01 MG tablet, Take 1 tablet by mouth daily., Disp: , Rfl:    Allergies  Allergen Reactions   Amoxicillin Hives    Drowsiness   American Cockroach     Reaction unknown.   Dust Mite Extract Other (See Comments)    Pressure in ears, stuffy nose and mucus drip in throat    Molds & Smuts     History reviewed. No pertinent past medical history.   History reviewed. No pertinent surgical history.  Family History  Problem Relation Age of Onset   Healthy Mother    Healthy Father     Social History   Tobacco Use   Smoking status: Never   Smokeless tobacco: Never  Substance Use Topics   Alcohol use: Never    ROS   Objective:    Vitals: BP 113/79 (BP Location: Left Arm)   Pulse (!) 103   Temp 98.8 F (37.1 C) (Oral)   Resp 16   LMP 11/19/2022 (Approximate)   SpO2 98%   Physical Exam Constitutional:      General: She is not in acute distress.    Appearance: Normal appearance. She is well-developed. She is not ill-appearing, toxic-appearing or diaphoretic.  HENT:     Head: Normocephalic and atraumatic.     Nose: Nose normal.     Mouth/Throat:     Mouth: Mucous membranes are moist.  Eyes:     General: No scleral icterus.       Right eye: No discharge.        Left eye: No discharge.     Extraocular Movements: Extraocular movements intact.  Cardiovascular:     Rate and Rhythm: Normal rate.  Pulmonary:     Effort: Pulmonary effort is normal.  Musculoskeletal:       Hands:  Skin:    General: Skin is warm and dry.  Neurological:     General: No focal deficit present.     Mental Status: She is alert and oriented to person, place, and time.  Psychiatric:        Mood and Affect: Mood normal.        Behavior: Behavior normal.    DG Finger Little Left  Result  Date: 11/27/2022 CLINICAL DATA:  Finger pain EXAM: LEFT FINGER(S) - 3 VIEW COMPARISON:  None Available. FINDINGS: No fracture or dislocation. Preserved joint spaces and bone mineralization. IMPRESSION: No acute osseous abnormality Electronically Signed   By: Jill Side M.D.   On: 11/27/2022 15:48    Buddy tape system applied to the left fourth and fifth fingers.  Assessment and Plan :   PDMP not reviewed this encounter.  1. Contusion of left little finger without damage to nail, initial encounter   2. Finger pain, left     Recommended conservative management for contusion versus strain.  Will have patient use the buddy tape system, ibuprofen at home. Counseled patient on potential for adverse effects with medications prescribed/recommended today, ER and return-to-clinic precautions discussed, patient verbalized understanding.    Jaynee Eagles, [place] 11/27/22 1556

## 2023-05-20 ENCOUNTER — Ambulatory Visit
Admission: RE | Admit: 2023-05-20 | Discharge: 2023-05-20 | Disposition: A | Discharge status: Home / Self Care | Payer: MEDICAID | Source: Ambulatory Visit | Attending: Physician Assistant | Admitting: Physician Assistant

## 2023-05-20 ENCOUNTER — Other Ambulatory Visit: Payer: Self-pay

## 2023-05-20 VITALS — BP 129/85 | HR 79 | Temp 98.7°F | Resp 18

## 2023-05-20 DIAGNOSIS — N898 Other specified noninflammatory disorders of vagina: Secondary | ICD-10-CM | POA: Insufficient documentation

## 2023-05-20 MED ORDER — FLUCONAZOLE 150 MG PO TABS: ORAL_TABLET | ORAL | 0 refills | Status: DC

## 2023-05-20 NOTE — ED Provider Notes (Signed)
[facility]    CSN: [id number] Arrival date & time: 05/20/23  1606      History   Chief Complaint Chief Complaint  Patient presents with   Vaginal Discharge    I'm pretty sure I have a yeast infection and would like a diflucan pill - Entered by patient    HPI Rita Ross is a 22 y.o. female.   Here today for evaluation of vaginal discharge she has had for 3 days.  She is concerned she may have a yeast infection.  She states she has not been sexually active in 1 year.  She denies any genital lesions or rashes.  She reports there is some itching associated with discharge.  She denies any abdominal pain, vomiting.  She does not report any other symptoms.  The history is provided by the patient.  Vaginal Discharge Associated symptoms: no abdominal pain, no fever, no nausea and no vomiting     History reviewed. No pertinent past medical history.  There are no problems to display for this patient.   History reviewed. No pertinent surgical history.  OB History   No obstetric history on file.      Home Medications    Prior to Admission medications   Medication Sig Start Date End Date Taking? Authorizing Provider  fluconazole (DIFLUCAN) 150 MG tablet Take one tab PO today and repeat dose in 3 days if symptoms persist 05/20/23  Yes Tomi Bamberger, PA-C  azelastine (ASTELIN) 0.1 % nasal spray SMARTSIG:2 Spray(s) Both Nares Twice Daily PRN 01/02/22   [provider]  cetirizine (ZYRTEC) 10 MG tablet Take 10 mg by mouth daily. 01/02/22   [provider]  fluticasone (FLONASE) 50 MCG/ACT nasal spray Administer 1 spray into each nostril Once Daily. 06/04/21 06/04/22  [provider]  hydrocortisone 2.5 % ointment Apply topically. 07/17/21   [provider]  magic mouthwash (lidocaine, diphenhydrAMINE, alum & mag hydroxide) suspension Swish and spit 5 mLs 3 (three) times daily as needed for mouth pain. 08/04/22   Tomi Bamberger,  PA-C  SIMPESSE 0.15-0.03 &0.01 MG tablet Take 1 tablet by mouth daily. 12/04/20   [provider]    Family History Family History  Problem Relation Age of Onset   Healthy Mother    Healthy Father     Social History Social History   Tobacco Use   Smoking status: Never   Smokeless tobacco: Never  Substance Use Topics   Alcohol use: Never     Allergies   Amoxicillin, American cockroach, Dust mite extract, and Molds & smuts   Review of Systems Review of Systems  Constitutional:  Negative for chills and fever.  Eyes:  Negative for discharge and redness.  Gastrointestinal:  Negative for abdominal pain, nausea and vomiting.  Genitourinary:  Positive for vaginal discharge. Negative for genital sores.     Physical Exam Triage Vital Signs ED Triage Vitals  Encounter Vitals Group     BP 05/20/23 1630 129/85     Systolic BP Percentile --      Diastolic BP Percentile --      Pulse Rate 05/20/23 1630 79     Resp 05/20/23 1630 18     Temp 05/20/23 1630 98.7 F (37.1 C)     Temp Source 05/20/23 1630 Oral     SpO2 05/20/23 1630 100 %     Weight --      Height --      Head Circumference --  Peak Flow --      Pain Score 05/20/23 1631 0     Pain Loc --      Pain Education --      Exclude from Growth Chart --    No data found.  Updated Vital Signs BP 129/85 (BP Location: Right Arm)   Pulse 79   Temp 98.7 F (37.1 C) (Oral)   Resp 18   SpO2 100%     Physical Exam Vitals and nursing note reviewed.  Constitutional:      General: She is not in acute distress.    Appearance: Normal appearance. She is not ill-appearing.  HENT:     Head: Normocephalic and atraumatic.  Eyes:     Conjunctiva/sclera: Conjunctivae normal.  Cardiovascular:     Rate and Rhythm: Normal rate.  Pulmonary:     Effort: Pulmonary effort is normal. No respiratory distress.  Neurological:     Mental Status: She is alert.  Psychiatric:        Mood and Affect: Mood normal.         Behavior: Behavior normal.        Thought Content: Thought content normal.      UC Treatments / Results  Labs (all labs ordered are listed, but only abnormal results are displayed) Labs Reviewed  CERVICOVAGINAL ANCILLARY ONLY    EKG   Radiology No results found.  Procedures Procedures (including critical care time)  Medications Ordered in UC Medications - No data to display  Initial Impression / Assessment and Plan / UC Course  I have reviewed the triage vital signs and the nursing notes.  Pertinent labs & imaging results that were available during my care of the patient were reviewed by me and considered in my medical decision making (see chart for details).    Diflucan prescribed given concerns for yeast infection I will order screening for same as well as BV, gonorrhea, trichomoniasis and chlamydia.  Encouraged follow-up with any further concerns otherwise will await results further recommendation.  Advised to avoid sexual intercourse while awaiting results.  Final Clinical Impressions(s) / UC Diagnoses   Final diagnoses:  Vaginal discharge   Discharge Instructions   None    ED Prescriptions     Medication Sig Dispense Auth. Provider   fluconazole (DIFLUCAN) 150 MG tablet Take one tab PO today and repeat dose in 3 days if symptoms persist 2 tablet Tomi Bamberger, PA-C      PDMP not reviewed this encounter.   Tomi Bamberger, PA-C 05/20/23 1718

## 2023-05-20 NOTE — ED Triage Notes (Signed)
Pt here for vaginal discharge x 3 days that she thinks is yeast infection

## 2023-05-21 LAB — CERVICOVAGINAL ANCILLARY ONLY
Bacterial Vaginitis (gardnerella): NEGATIVE
Candida Glabrata: POSITIVE — AB
Candida Vaginitis: POSITIVE — AB
Chlamydia: NEGATIVE
Comment: NEGATIVE
Comment: NEGATIVE
Comment: NEGATIVE
Comment: NEGATIVE
Comment: NEGATIVE
Comment: NORMAL
Neisseria Gonorrhea: NEGATIVE
Trichomonas: NEGATIVE

## 2023-09-25 ENCOUNTER — Ambulatory Visit: Payer: MEDICAID | Admitting: Plastic Surgery

## 2023-09-25 ENCOUNTER — Encounter: Payer: Self-pay | Admitting: Plastic Surgery

## 2023-09-25 VITALS — BP 123/87 | HR 86 | Ht 64.0 in | Wt 169.6 lb

## 2023-09-25 DIAGNOSIS — M4004 Postural kyphosis, thoracic region: Secondary | ICD-10-CM

## 2023-09-25 DIAGNOSIS — M542 Cervicalgia: Secondary | ICD-10-CM

## 2023-09-25 DIAGNOSIS — M546 Pain in thoracic spine: Secondary | ICD-10-CM | POA: Diagnosis not present

## 2023-09-25 DIAGNOSIS — M545 Low back pain, unspecified: Secondary | ICD-10-CM | POA: Diagnosis not present

## 2023-09-25 DIAGNOSIS — N62 Hypertrophy of breast: Secondary | ICD-10-CM

## 2023-09-25 NOTE — Progress Notes (Signed)
Referring Provider No referring provider defined for this encounter.   CC:  Chief Complaint  Patient presents with   Advice Only      Rita Ross is an 23 y.o. female.  HPI: Ms. Mcartor is a 23 year old female who has been seen multiple times by multiple plastic surgeons regarding bilateral breast reduction.  She has been approved for this procedure in the past.  She has had difficulty with large breast and upper back and neck pain since at least the age 66.  She is still interested in a breast reduction.  Allergies  Allergen Reactions   Amoxicillin Hives    Drowsiness   American Cockroach     Reaction unknown.   Dust Mite Extract Other (See Comments)    Pressure in ears, stuffy nose and mucus drip in throat    Molds & Smuts     Outpatient Encounter Medications as of 09/25/2023  Medication Sig   Azelaic Acid 15 % gel Apply topically daily.   azelastine (ASTELIN) 0.1 % nasal spray SMARTSIG:2 Spray(s) Both Nares Twice Daily PRN   cetirizine (ZYRTEC) 10 MG tablet Take 10 mg by mouth daily.   fluticasone (FLONASE) 50 MCG/ACT nasal spray Administer one spray into each nostril ONCE daily.   hydrocortisone 2.5 % ointment Apply topically. Apply 2 (two) times a day as needed for rash.   RETIN-A 0.025 % cream SMARTSIG:Sparingly Topical Every Night   SIMPESSE 0.15-0.03 &0.01 MG tablet Take 1 tablet by mouth daily.   VENTOLIN HFA 108 (90 Base) MCG/ACT inhaler 2 puffs every 4 (four) hours as needed for wheezing or shortness of breath.   fluticasone (FLONASE) 50 MCG/ACT nasal spray Administer 1 spray into each nostril Once Daily.   [DISCONTINUED] fluconazole (DIFLUCAN) 150 MG tablet Take one tab PO today and repeat dose in 3 days if symptoms persist   [DISCONTINUED] magic mouthwash (lidocaine, diphenhydrAMINE, alum & mag hydroxide) suspension Swish and spit 5 mLs 3 (three) times daily as needed for mouth pain.   No facility-administered encounter medications on file as of  09/25/2023.     No past medical history on file.  No past surgical history on file.  Family History  Problem Relation Age of Onset   Healthy Mother    Healthy Father     Social History   Social History Narrative   Not on file     Review of Systems General: Denies fevers, chills, weight loss CV: Denies chest pain, shortness of breath, palpitations Breast: Large breasts which patient feels is contributing to her upper back and neck pain  Physical Exam    09/25/2023    2:01 PM 05/20/2023    4:30 PM 11/27/2022    2:53 PM  Vitals with BMI  Height 5\' 4"     Weight 169 lbs 10 oz    BMI 29.1    Systolic 123 129 409  Diastolic 87 85 79  Pulse 86 79 103    General:  No acute distress,  Alert and oriented, Non-Toxic, Normal speech and affect Breasts: Patient has large pendulous breast with grade 3 ptosis.  There are no dominant masses on physical exam.  The measurements are already documented in her chart.  Nipples are normal in appearance without evidence of nipple discharge Mammogram: Applicable due to age Assessment/Plan Macromastia: Patient has a multiyear history of complaints of upper back and neck pain secondary to large breast size.  I believe that she would benefit from a bilateral breast reduction.  I believe  that I can remove 700 g per breast.  She understands where the incisions will be and the unpredictable nature of scarring.  She had no questions about the postoperative activity or limitations.  Does know that she will have drains postoperatively and these will be removed the day after surgery.  All questions were answered to her satisfaction.  There were multiple photographs in her chart her new photographs were not obtained today we will resubmit her for bilateral breast reduction at her request.  Santiago Glad 09/25/2023, 2:50 PM

## 2023-10-20 DIAGNOSIS — Z719 Counseling, unspecified: Secondary | ICD-10-CM

## 2023-10-28 ENCOUNTER — Encounter: Payer: Self-pay | Admitting: Student

## 2023-10-28 ENCOUNTER — Ambulatory Visit (INDEPENDENT_AMBULATORY_CARE_PROVIDER_SITE_OTHER): Payer: MEDICAID | Admitting: Student

## 2023-10-28 VITALS — BP 133/85 | HR 73

## 2023-10-28 DIAGNOSIS — N62 Hypertrophy of breast: Secondary | ICD-10-CM

## 2023-10-28 MED ORDER — OXYCODONE HCL 5 MG PO TABS: 5.0000 mg | ORAL_TABLET | Freq: Four times a day (QID) | ORAL | 0 refills | Status: DC | PRN

## 2023-10-28 MED ORDER — ONDANSETRON HCL 4 MG PO TABS: 4.0000 mg | ORAL_TABLET | Freq: Three times a day (TID) | ORAL | 0 refills | Status: DC | PRN

## 2023-10-28 NOTE — Progress Notes (Signed)
 Patient ID: Rita Ross, female    DOB: 05/10/2001, 23 y.o.   MRN: [id number]  Chief Complaint  Patient presents with   Pre-op Exam      ICD-10-CM   1. Macromastia  N62        History of Present Illness: Rita Ross is a 23 y.o.  female  with a history of macromastia.  She presents for preoperative evaluation for upcoming procedure, Bilateral Breast Reduction, scheduled for 11/21/23 with Dr.  Ladona Ridgel  The patient has not had problems with anesthesia.  Patient denies any history of cardiac disease.  She denies taking any blood thinners.  Patient reports she has never needed a mammogram.  She denies any personal history of breast cancer.  She reports that her maternal aunt has had breast cancer.  Patient reports she is currently taking birth control.  She denies any history of miscarriages.  She denies any personal or family history of blood clots or clotting diseases.  She denies any recent traumas, surgeries, infections.  She denies any history of stroke or heart attack.  She denies any history of Crohn's disease or ulcerative colitis.  She does reports she had what appeared to be an allergic reaction/inflammation to her lungs several years ago and was following up with pulmonology for that.  She states that since then, she has been following up with allergy and immunology and her PCP.  She states that the last time she saw pulmonology was in 2021.  She reports that she has an inhaler and that she uses it about once a month if she is having a flareup.  She denies any other issues or concerns with this or her breathing.  Patient denies any history of cancer.  She denies any varicosities to her lower extremities.  She denies any recent fevers, chills or changes in her health.  Summary of Previous Visit: Patient was seen for initial consult by Dr. Ladona Ridgel on 06/13/2022.  At this visit, patient was found to be a good candidate for bilateral breast reduction.  Estimated excess  breast tissue to be removed at time of surgery: 700 grams  Job: Currently goes to school, planning to go back to school the Tuesday after surgery  PMH Significant for: Chronic cough and rhinitis, macromastia   Past Medical History: Allergies: Allergies  Allergen Reactions   Amoxicillin Hives    Drowsiness   American Cockroach     Reaction unknown.   Dust Mite Extract Other (See Comments)    Pressure in ears, stuffy nose and mucus drip in throat    Molds & Smuts     Current Medications:  Current Outpatient Medications:    Azelaic Acid 15 % gel, Apply topically daily., Disp: , Rfl:    azelastine (ASTELIN) 0.1 % nasal spray, SMARTSIG:2 Spray(s) Both Nares Twice Daily PRN, Disp: , Rfl:    cetirizine (ZYRTEC) 10 MG tablet, Take 10 mg by mouth daily., Disp: , Rfl:    fluticasone (FLONASE) 50 MCG/ACT nasal spray, Administer one spray into each nostril ONCE daily., Disp: , Rfl:    hydrocortisone 2.5 % ointment, Apply topically. Apply 2 (two) times a day as needed for rash., Disp: , Rfl:    RETIN-A 0.025 % cream, SMARTSIG:Sparingly Topical Every Night, Disp: , Rfl:    SIMPESSE 0.15-0.03 &0.01 MG tablet, Take 1 tablet by mouth daily., Disp: , Rfl:    VENTOLIN HFA 108 (90 Base) MCG/ACT inhaler, 2 puffs every 4 (four) hours as needed for wheezing  or shortness of breath., Disp: , Rfl:    fluticasone (FLONASE) 50 MCG/ACT nasal spray, Administer 1 spray into each nostril Once Daily., Disp: , Rfl:   Past Medical Problems: History reviewed. No pertinent past medical history.  Past Surgical History: History reviewed. No pertinent surgical history.  Social History: Social History   Socioeconomic History   Marital status: Single    Spouse name: Not on file   Number of children: Not on file   Years of education: Not on file   Highest education level: Not on file  Occupational History   Not on file  Tobacco Use   Smoking status: Never   Smokeless tobacco: Never  Substance and Sexual  Activity   Alcohol use: Never   Drug use: Not on file   Sexual activity: Not on file  Other Topics Concern   Not on file  Social History Narrative   Not on file   Social Drivers of Health   Financial Resource Strain: Not on file  Food Insecurity: Medium Risk (06/13/2023)   Received from [facility]   Hunger Vital Sign    Worried About Running Out of Food in the Last Year: Sometimes true    Ran Out of Food in the Last Year: Sometimes true  Transportation Needs: No Transportation Needs (06/13/2023)   Received from [facility]    In the past 12 months, has lack of reliable transportation kept you from medical appointments, meetings, work or from getting things needed for daily living? : No  Physical Activity: Not on file  Stress: Not on file  Social Connections: Not on file  Intimate Partner Violence: Not on file    Family History: Family History  Problem Relation Age of Onset   Healthy Mother    Healthy Father     Review of Systems: Denies any fevers or chills  Physical Exam: Vital Signs BP 133/85 (BP Location: Left Arm, Patient Position: Sitting, Cuff Size: Normal)   Pulse 73   SpO2 100%   Physical Exam  Constitutional:      General: Not in acute distress.    Appearance: Normal appearance. Not ill-appearing.  HENT:     Head: Normocephalic and atraumatic.  Neck:     Musculoskeletal: Normal range of motion.  Cardiovascular:     Rate and Rhythm: Normal rate Pulmonary:     Effort: Pulmonary effort is normal. No respiratory distress.  Musculoskeletal: Normal range of motion.  Skin:    General: Skin is warm and dry.     Findings: No erythema or rash.  Neurological:     Mental Status: Alert and oriented to person, place, and time. Mental status is at baseline.  Psychiatric:        Mood and Affect: Mood normal.        Behavior: Behavior normal.    Assessment/Plan: The patient is scheduled for bilateral breast reduction with Dr. Ladona Ridgel.   Risks, benefits, and alternatives of procedure discussed, questions answered and consent obtained.    Smoking Status: Non-smoker; Counseling Given?  N/A Last Mammogram: N/A due to age  Caprini Score: 3; Risk Factors include: BMI > 25, and length of planned surgery. Recommendation for mechanical prophylaxis. Encourage early ambulation.   Pictures obtained: @consult   Post-op Rx sent to pharmacy: Oxycodone, Zofran  I instructed the patient to hold any multivitamins or supplements at least 1 week prior to surgery.  Recommended that patient hold her Simpesse 2 weeks before and 2 weeks after surgery.  Patient expressed understanding.  Patient was provided with the breast reduction and General Surgical Risk consent document and Pain Medication Agreement prior to their appointment.  They had adequate time to read through the risk consent documents and Pain Medication Agreement. We also discussed them in person together during this preop appointment. All of their questions were answered to their satisfaction.  Recommended calling if they have any further questions.  Risk consent form and Pain Medication Agreement to be scanned into patient's chart.  The risk that can be encountered with breast reduction were discussed and include the following but not limited to these:  Breast asymmetry, fluid accumulation, firmness of the breast, inability to breast feed, loss of nipple or areola, skin loss, decrease or no nipple sensation, fat necrosis of the breast tissue, bleeding, infection, healing delay.  There are risks of anesthesia, changes to skin sensation and injury to nerves or blood vessels.  The muscle can be temporarily or permanently injured.  You may have an allergic reaction to tape, suture, glue, blood products which can result in skin discoloration, swelling, pain, skin lesions, poor healing.  Any of these can lead to the need for revisonal surgery or stage procedures.  A reduction has potential to  interfere with diagnostic procedures.  Nipple or breast piercing can increase risks of infection.  This procedure is best done when the breast is fully developed.  Changes in the breast will continue to occur over time.  Pregnancy can alter the outcomes of previous breast reduction surgery, weight gain and weigh loss can also effect the long term appearance.   We discussed the possibility of amputation/free nipple graft technique due to the length of her STN.  She is understanding of the possibility that we would need to transition from a pedicle technique to a free nipple graft technique intraoperatively.  We discussed the risks associated with free nipple graft breast reductions, including but not limited to failure of the graft, partial loss of the graft, loss of sensation of bilateral nipple areola, complete loss of the nipple areola graft, inability to breast-feed, postoperative wounds, ongoing wound care.  We also discussed the risks associated with the pedicle technique.  We discussed that with the pedicle technique she could develop nipple areolar necrosis which would result in loss of the nipple, this would also result in ongoing wound care and possible changes in the shape of her breast.       Electronically signed by: Laurena Spies, PA-C 10/28/2023 4:07 PM

## 2023-10-28 NOTE — H&P (View-Only) (Signed)
 Patient ID: Rita Ross, female    DOB: 05/10/2001, 23 y.o.   MRN: [id number]  Chief Complaint  Patient presents with   Pre-op Exam      ICD-10-CM   1. Macromastia  N62        History of Present Illness: Rita Ross is a 23 y.o.  female  with a history of macromastia.  She presents for preoperative evaluation for upcoming procedure, Bilateral Breast Reduction, scheduled for 11/21/23 with Dr.  Ladona Ridgel  The patient has not had problems with anesthesia.  Patient denies any history of cardiac disease.  She denies taking any blood thinners.  Patient reports she has never needed a mammogram.  She denies any personal history of breast cancer.  She reports that her maternal aunt has had breast cancer.  Patient reports she is currently taking birth control.  She denies any history of miscarriages.  She denies any personal or family history of blood clots or clotting diseases.  She denies any recent traumas, surgeries, infections.  She denies any history of stroke or heart attack.  She denies any history of Crohn's disease or ulcerative colitis.  She does reports she had what appeared to be an allergic reaction/inflammation to her lungs several years ago and was following up with pulmonology for that.  She states that since then, she has been following up with allergy and immunology and her PCP.  She states that the last time she saw pulmonology was in 2021.  She reports that she has an inhaler and that she uses it about once a month if she is having a flareup.  She denies any other issues or concerns with this or her breathing.  Patient denies any history of cancer.  She denies any varicosities to her lower extremities.  She denies any recent fevers, chills or changes in her health.  Summary of Previous Visit: Patient was seen for initial consult by Dr. Ladona Ridgel on 06/13/2022.  At this visit, patient was found to be a good candidate for bilateral breast reduction.  Estimated excess  breast tissue to be removed at time of surgery: 700 grams  Job: Currently goes to school, planning to go back to school the Tuesday after surgery  PMH Significant for: Chronic cough and rhinitis, macromastia   Past Medical History: Allergies: Allergies  Allergen Reactions   Amoxicillin Hives    Drowsiness   American Cockroach     Reaction unknown.   Dust Mite Extract Other (See Comments)    Pressure in ears, stuffy nose and mucus drip in throat    Molds & Smuts     Current Medications:  Current Outpatient Medications:    Azelaic Acid 15 % gel, Apply topically daily., Disp: , Rfl:    azelastine (ASTELIN) 0.1 % nasal spray, SMARTSIG:2 Spray(s) Both Nares Twice Daily PRN, Disp: , Rfl:    cetirizine (ZYRTEC) 10 MG tablet, Take 10 mg by mouth daily., Disp: , Rfl:    fluticasone (FLONASE) 50 MCG/ACT nasal spray, Administer one spray into each nostril ONCE daily., Disp: , Rfl:    hydrocortisone 2.5 % ointment, Apply topically. Apply 2 (two) times a day as needed for rash., Disp: , Rfl:    RETIN-A 0.025 % cream, SMARTSIG:Sparingly Topical Every Night, Disp: , Rfl:    SIMPESSE 0.15-0.03 &0.01 MG tablet, Take 1 tablet by mouth daily., Disp: , Rfl:    VENTOLIN HFA 108 (90 Base) MCG/ACT inhaler, 2 puffs every 4 (four) hours as needed for wheezing  or shortness of breath., Disp: , Rfl:    fluticasone (FLONASE) 50 MCG/ACT nasal spray, Administer 1 spray into each nostril Once Daily., Disp: , Rfl:   Past Medical Problems: History reviewed. No pertinent past medical history.  Past Surgical History: History reviewed. No pertinent surgical history.  Social History: Social History   Socioeconomic History   Marital status: Single    Spouse name: Not on file   Number of children: Not on file   Years of education: Not on file   Highest education level: Not on file  Occupational History   Not on file  Tobacco Use   Smoking status: Never   Smokeless tobacco: Never  Substance and Sexual  Activity   Alcohol use: Never   Drug use: Not on file   Sexual activity: Not on file  Other Topics Concern   Not on file  Social History Narrative   Not on file   Social Drivers of Health   Financial Resource Strain: Not on file  Food Insecurity: Medium Risk (06/13/2023)   Received from [facility]   Hunger Vital Sign    Worried About Running Out of Food in the Last Year: Sometimes true    Ran Out of Food in the Last Year: Sometimes true  Transportation Needs: No Transportation Needs (06/13/2023)   Received from [facility]    In the past 12 months, has lack of reliable transportation kept you from medical appointments, meetings, work or from getting things needed for daily living? : No  Physical Activity: Not on file  Stress: Not on file  Social Connections: Not on file  Intimate Partner Violence: Not on file    Family History: Family History  Problem Relation Age of Onset   Healthy Mother    Healthy Father     Review of Systems: Denies any fevers or chills  Physical Exam: Vital Signs BP 133/85 (BP Location: Left Arm, Patient Position: Sitting, Cuff Size: Normal)   Pulse 73   SpO2 100%   Physical Exam  Constitutional:      General: Not in acute distress.    Appearance: Normal appearance. Not ill-appearing.  HENT:     Head: Normocephalic and atraumatic.  Neck:     Musculoskeletal: Normal range of motion.  Cardiovascular:     Rate and Rhythm: Normal rate Pulmonary:     Effort: Pulmonary effort is normal. No respiratory distress.  Musculoskeletal: Normal range of motion.  Skin:    General: Skin is warm and dry.     Findings: No erythema or rash.  Neurological:     Mental Status: Alert and oriented to person, place, and time. Mental status is at baseline.  Psychiatric:        Mood and Affect: Mood normal.        Behavior: Behavior normal.    Assessment/Plan: The patient is scheduled for bilateral breast reduction with Dr. Ladona Ridgel.   Risks, benefits, and alternatives of procedure discussed, questions answered and consent obtained.    Smoking Status: Non-smoker; Counseling Given?  N/A Last Mammogram: N/A due to age  Caprini Score: 3; Risk Factors include: BMI > 25, and length of planned surgery. Recommendation for mechanical prophylaxis. Encourage early ambulation.   Pictures obtained: @consult   Post-op Rx sent to pharmacy: Oxycodone, Zofran  I instructed the patient to hold any multivitamins or supplements at least 1 week prior to surgery.  Recommended that patient hold her Simpesse 2 weeks before and 2 weeks after surgery.  Patient expressed understanding.  Patient was provided with the breast reduction and General Surgical Risk consent document and Pain Medication Agreement prior to their appointment.  They had adequate time to read through the risk consent documents and Pain Medication Agreement. We also discussed them in person together during this preop appointment. All of their questions were answered to their satisfaction.  Recommended calling if they have any further questions.  Risk consent form and Pain Medication Agreement to be scanned into patient's chart.  The risk that can be encountered with breast reduction were discussed and include the following but not limited to these:  Breast asymmetry, fluid accumulation, firmness of the breast, inability to breast feed, loss of nipple or areola, skin loss, decrease or no nipple sensation, fat necrosis of the breast tissue, bleeding, infection, healing delay.  There are risks of anesthesia, changes to skin sensation and injury to nerves or blood vessels.  The muscle can be temporarily or permanently injured.  You may have an allergic reaction to tape, suture, glue, blood products which can result in skin discoloration, swelling, pain, skin lesions, poor healing.  Any of these can lead to the need for revisonal surgery or stage procedures.  A reduction has potential to  interfere with diagnostic procedures.  Nipple or breast piercing can increase risks of infection.  This procedure is best done when the breast is fully developed.  Changes in the breast will continue to occur over time.  Pregnancy can alter the outcomes of previous breast reduction surgery, weight gain and weigh loss can also effect the long term appearance.   We discussed the possibility of amputation/free nipple graft technique due to the length of her STN.  She is understanding of the possibility that we would need to transition from a pedicle technique to a free nipple graft technique intraoperatively.  We discussed the risks associated with free nipple graft breast reductions, including but not limited to failure of the graft, partial loss of the graft, loss of sensation of bilateral nipple areola, complete loss of the nipple areola graft, inability to breast-feed, postoperative wounds, ongoing wound care.  We also discussed the risks associated with the pedicle technique.  We discussed that with the pedicle technique she could develop nipple areolar necrosis which would result in loss of the nipple, this would also result in ongoing wound care and possible changes in the shape of her breast.       Electronically signed by: Laurena Spies, PA-C 10/28/2023 4:07 PM

## 2023-11-14 ENCOUNTER — Encounter (HOSPITAL_BASED_OUTPATIENT_CLINIC_OR_DEPARTMENT_OTHER): Payer: Self-pay | Admitting: Plastic Surgery

## 2023-11-14 ENCOUNTER — Other Ambulatory Visit: Payer: Self-pay

## 2023-11-21 ENCOUNTER — Other Ambulatory Visit: Payer: Self-pay

## 2023-11-21 ENCOUNTER — Ambulatory Visit (HOSPITAL_BASED_OUTPATIENT_CLINIC_OR_DEPARTMENT_OTHER): Payer: MEDICAID | Admitting: Anesthesiology

## 2023-11-21 ENCOUNTER — Ambulatory Visit (HOSPITAL_BASED_OUTPATIENT_CLINIC_OR_DEPARTMENT_OTHER)
Admission: RE | Admit: 2023-11-21 | Discharge: 2023-11-21 | Disposition: A | Discharge status: Home / Self Care | Payer: MEDICAID | Source: Ambulatory Visit | Attending: Plastic Surgery | Admitting: Plastic Surgery

## 2023-11-21 ENCOUNTER — Encounter (HOSPITAL_BASED_OUTPATIENT_CLINIC_OR_DEPARTMENT_OTHER)
Admission: RE | Disposition: A | Discharge status: Home / Self Care | Payer: Self-pay | Source: Ambulatory Visit | Attending: Plastic Surgery

## 2023-11-21 ENCOUNTER — Encounter (HOSPITAL_BASED_OUTPATIENT_CLINIC_OR_DEPARTMENT_OTHER): Payer: Self-pay | Admitting: Plastic Surgery

## 2023-11-21 DIAGNOSIS — Z793 Long term (current) use of hormonal contraceptives: Secondary | ICD-10-CM | POA: Insufficient documentation

## 2023-11-21 DIAGNOSIS — Z803 Family history of malignant neoplasm of breast: Secondary | ICD-10-CM | POA: Insufficient documentation

## 2023-11-21 DIAGNOSIS — Z79899 Other long term (current) drug therapy: Secondary | ICD-10-CM | POA: Insufficient documentation

## 2023-11-21 DIAGNOSIS — N62 Hypertrophy of breast: Secondary | ICD-10-CM

## 2023-11-21 DIAGNOSIS — Z01818 Encounter for other preprocedural examination: Secondary | ICD-10-CM

## 2023-11-21 HISTORY — DX: Unspecified asthma, uncomplicated: J45.909

## 2023-11-21 HISTORY — DX: Hypertrophy of breast: N62

## 2023-11-21 HISTORY — PX: BREAST REDUCTION SURGERY: SHX8

## 2023-11-21 LAB — POCT PREGNANCY, URINE: Preg Test, Ur: NEGATIVE

## 2023-11-21 SURGERY — MAMMOPLASTY, REDUCTION
Anesthesia: General | Laterality: Bilateral

## 2023-11-21 MED ORDER — HYDROMORPHONE HCL 1 MG/ML IJ SOLN
INTRAMUSCULAR | Status: AC
  Filled 2023-11-21: qty 0.5

## 2023-11-21 MED ORDER — DEXMEDETOMIDINE HCL IN NACL 80 MCG/20ML IV SOLN
INTRAVENOUS | Status: DC | PRN
  Administered 2023-11-21: 8 ug via INTRAVENOUS
  Administered 2023-11-21: 12 ug via INTRAVENOUS
  Administered 2023-11-21: 8 ug via INTRAVENOUS

## 2023-11-21 MED ORDER — ONDANSETRON HCL 4 MG/2ML IJ SOLN
INTRAMUSCULAR | Status: AC
  Filled 2023-11-21: qty 2

## 2023-11-21 MED ORDER — FENTANYL CITRATE (PF) 100 MCG/2ML IJ SOLN
INTRAMUSCULAR | Status: AC
  Filled 2023-11-21: qty 2

## 2023-11-21 MED ORDER — LIDOCAINE 2% (20 MG/ML) 5 ML SYRINGE
INTRAMUSCULAR | Status: AC
  Filled 2023-11-21: qty 5

## 2023-11-21 MED ORDER — SODIUM CHLORIDE (PF) 0.9 % IJ SOLN
INTRAMUSCULAR | Status: DC | PRN
  Administered 2023-11-21: 100 mL

## 2023-11-21 MED ORDER — CHLORHEXIDINE GLUCONATE CLOTH 2 % EX PADS: 6.0000 | MEDICATED_PAD | Freq: Once | CUTANEOUS | Status: DC

## 2023-11-21 MED ORDER — BUPIVACAINE LIPOSOME 1.3 % IJ SUSP
INTRAMUSCULAR | Status: AC
  Filled 2023-11-21: qty 20

## 2023-11-21 MED ORDER — VANCOMYCIN HCL IN DEXTROSE 1-5 GM/200ML-% IV SOLN
1000.0000 mg | INTRAVENOUS | Status: AC
  Administered 2023-11-21: 1000 mg via INTRAVENOUS

## 2023-11-21 MED ORDER — DEXAMETHASONE SODIUM PHOSPHATE 10 MG/ML IJ SOLN
INTRAMUSCULAR | Status: DC | PRN
  Administered 2023-11-21: 10 mg via INTRAVENOUS

## 2023-11-21 MED ORDER — ROCURONIUM BROMIDE 10 MG/ML (PF) SYRINGE
PREFILLED_SYRINGE | INTRAVENOUS | Status: AC
  Filled 2023-11-21: qty 10

## 2023-11-21 MED ORDER — FENTANYL CITRATE (PF) 100 MCG/2ML IJ SOLN
INTRAMUSCULAR | Status: DC | PRN
  Administered 2023-11-21 (×4): 50 ug via INTRAVENOUS

## 2023-11-21 MED ORDER — HYDROMORPHONE HCL 1 MG/ML IJ SOLN
0.2500 mg | INTRAMUSCULAR | Status: DC | PRN
  Administered 2023-11-21: 0.5 mg via INTRAVENOUS
  Administered 2023-11-21: 0.25 mg via INTRAVENOUS

## 2023-11-21 MED ORDER — VANCOMYCIN HCL IN DEXTROSE 1-5 GM/200ML-% IV SOLN
INTRAVENOUS | Status: AC
Start: 2023-11-21 — End: ?
  Filled 2023-11-21: qty 200

## 2023-11-21 MED ORDER — DEXAMETHASONE SODIUM PHOSPHATE 10 MG/ML IJ SOLN
INTRAMUSCULAR | Status: AC
  Filled 2023-11-21: qty 1

## 2023-11-21 MED ORDER — PHENYLEPHRINE 80 MCG/ML (10ML) SYRINGE FOR IV PUSH (FOR BLOOD PRESSURE SUPPORT)
PREFILLED_SYRINGE | INTRAVENOUS | Status: AC
Start: 2023-11-21 — End: ?
  Filled 2023-11-21: qty 10

## 2023-11-21 MED ORDER — PHENYLEPHRINE 80 MCG/ML (10ML) SYRINGE FOR IV PUSH (FOR BLOOD PRESSURE SUPPORT)
PREFILLED_SYRINGE | INTRAVENOUS | Status: DC | PRN
Start: 2023-11-21 — End: 2023-11-21
  Administered 2023-11-21 (×2): 160 ug via INTRAVENOUS
  Administered 2023-11-21 (×2): 80 ug via INTRAVENOUS
  Administered 2023-11-21: 160 ug via INTRAVENOUS

## 2023-11-21 MED ORDER — MIDAZOLAM HCL 2 MG/2ML IJ SOLN
INTRAMUSCULAR | Status: AC
  Filled 2023-11-21: qty 2

## 2023-11-21 MED ORDER — LIDOCAINE 2% (20 MG/ML) 5 ML SYRINGE
INTRAMUSCULAR | Status: DC | PRN
  Administered 2023-11-21: 60 mg via INTRAVENOUS

## 2023-11-21 MED ORDER — HYDROMORPHONE HCL 1 MG/ML IJ SOLN
INTRAMUSCULAR | Status: DC | PRN
  Administered 2023-11-21: .5 mg via INTRAVENOUS

## 2023-11-21 MED ORDER — PROPOFOL 10 MG/ML IV BOLUS
INTRAVENOUS | Status: DC | PRN
  Administered 2023-11-21: 200 mg via INTRAVENOUS

## 2023-11-21 MED ORDER — 0.9 % SODIUM CHLORIDE (POUR BTL) OPTIME
TOPICAL | Status: DC | PRN
  Administered 2023-11-21 (×3): 1000 mL

## 2023-11-21 MED ORDER — ONDANSETRON HCL 4 MG/2ML IJ SOLN
INTRAMUSCULAR | Status: DC | PRN
  Administered 2023-11-21 (×2): 4 mg via INTRAVENOUS

## 2023-11-21 MED ORDER — HYDROMORPHONE HCL 1 MG/ML IJ SOLN
INTRAMUSCULAR | Status: AC
Start: 2023-11-21 — End: ?
  Filled 2023-11-21: qty 0.5

## 2023-11-21 MED ORDER — SCOPOLAMINE 1 MG/3DAYS TD PT72
MEDICATED_PATCH | TRANSDERMAL | Status: AC
  Filled 2023-11-21: qty 1

## 2023-11-21 MED ORDER — LACTATED RINGERS IV SOLN: INTRAVENOUS | Status: DC

## 2023-11-21 MED ORDER — MIDAZOLAM HCL 5 MG/5ML IJ SOLN
INTRAMUSCULAR | Status: DC | PRN
  Administered 2023-11-21: 2 mg via INTRAVENOUS

## 2023-11-21 MED ORDER — VANCOMYCIN HCL 1000 MG IV SOLR
INTRAVENOUS | Status: DC | PRN
  Administered 2023-11-21: 1000 mg via INTRAVENOUS

## 2023-11-21 MED ORDER — ACETAMINOPHEN 500 MG PO TABS
1000.0000 mg | ORAL_TABLET | Freq: Once | ORAL | Status: AC
  Administered 2023-11-21: 1000 mg via ORAL

## 2023-11-21 SURGICAL SUPPLY — 55 items
BINDER BREAST 3XL (GAUZE/BANDAGES/DRESSINGS) IMPLANT
BINDER BREAST LRG (GAUZE/BANDAGES/DRESSINGS) IMPLANT
BINDER BREAST MEDIUM (GAUZE/BANDAGES/DRESSINGS) IMPLANT
BINDER BREAST XLRG (GAUZE/BANDAGES/DRESSINGS) IMPLANT
BINDER BREAST XXLRG (GAUZE/BANDAGES/DRESSINGS) IMPLANT
BIOPATCH RED 1 DISK 7.0 (GAUZE/BANDAGES/DRESSINGS) ×2 IMPLANT
BLADE SURG 10 STRL SS (BLADE) ×6 IMPLANT
BLADE SURG 15 STRL LF DISP TIS (BLADE) ×1 IMPLANT
CANISTER SUCT 1200ML W/VALVE (MISCELLANEOUS) ×1 IMPLANT
DERMABOND ADVANCED .7 DNX12 (GAUZE/BANDAGES/DRESSINGS) ×2 IMPLANT
DRAIN CHANNEL 19F RND (DRAIN) ×2 IMPLANT
DRAPE IMP U-DRAPE 54X76 (DRAPES) IMPLANT
DRAPE UTILITY XL STRL (DRAPES) ×1 IMPLANT
DRSG TEGADERM 4X4.75 (GAUZE/BANDAGES/DRESSINGS) ×2 IMPLANT
ELECT BLADE 4.0 EZ CLEAN MEGAD (MISCELLANEOUS) ×1 IMPLANT
ELECT REM PT RETURN 9FT ADLT (ELECTROSURGICAL) ×2 IMPLANT
ELECTRODE BLDE 4.0 EZ CLN MEGD (MISCELLANEOUS) ×1 IMPLANT
ELECTRODE REM PT RTRN 9FT ADLT (ELECTROSURGICAL) ×2 IMPLANT
EVACUATOR SILICONE 100CC (DRAIN) ×2 IMPLANT
GAUZE PAD ABD 8X10 STRL (GAUZE/BANDAGES/DRESSINGS) ×4 IMPLANT
GAUZE SPONGE 2X2 STRL 8-PLY (GAUZE/BANDAGES/DRESSINGS) ×2 IMPLANT
GLOVE BIO SURGEON STRL SZ 6.5 (GLOVE) IMPLANT
GLOVE BIO SURGEON STRL SZ7.5 (GLOVE) IMPLANT
GLOVE BIO SURGEON STRL SZ8 (GLOVE) ×1 IMPLANT
GLOVE BIOGEL PI IND STRL 7.0 (GLOVE) IMPLANT
GLOVE BIOGEL PI IND STRL 8 (GLOVE) ×1 IMPLANT
GOWN STRL REUS W/ TWL LRG LVL3 (GOWN DISPOSABLE) ×1 IMPLANT
GOWN STRL REUS W/TWL XL LVL3 (GOWN DISPOSABLE) ×1 IMPLANT
HEMOSTAT ARISTA ABSORB 3G PWDR (HEMOSTASIS) IMPLANT
HIBICLENS CHG 4% 4OZ BTL (MISCELLANEOUS) ×1 IMPLANT
MARKER SKIN DUAL TIP RULER LAB (MISCELLANEOUS) ×1 IMPLANT
NDL HYPO 22X1.5 SAFETY MO (MISCELLANEOUS) ×2 IMPLANT
NEEDLE HYPO 22X1.5 SAFETY MO (MISCELLANEOUS) ×2 IMPLANT
NS IRRIG 1000ML POUR BTL (IV SOLUTION) ×1 IMPLANT
PACK BASIN DAY SURGERY FS (CUSTOM PROCEDURE TRAY) ×1 IMPLANT
PACK UNIVERSAL I (CUSTOM PROCEDURE TRAY) ×1 IMPLANT
PENCIL SMOKE EVACUATOR (MISCELLANEOUS) ×2 IMPLANT
PIN SAFETY STERILE (MISCELLANEOUS) ×1 IMPLANT
SLEEVE SCD COMPRESS KNEE MED (STOCKING) ×1 IMPLANT
SPONGE T-LAP 18X18 ~~LOC~~+RFID (SPONGE) ×3 IMPLANT
STAPLER SKIN PROX WIDE 3.9 (STAPLE) ×1 IMPLANT
SUT MNCRL AB 3-0 PS2 27 (SUTURE) ×4 IMPLANT
SUT MNCRL AB 4-0 PS2 18 (SUTURE) ×4 IMPLANT
SUT MON AB 2-0 CT1 36 (SUTURE) ×1 IMPLANT
SUT MON AB 5-0 PS2 18 (SUTURE) IMPLANT
SUT SILK 2 0 SH (SUTURE) ×2 IMPLANT
SUT VIC AB 3-0 SH 27X BRD (SUTURE) IMPLANT
SYR 20ML LL LF (SYRINGE) ×2 IMPLANT
SYR BULB IRRIG 60ML STRL (SYRINGE) ×1 IMPLANT
SYR CONTROL 10ML LL (SYRINGE) ×1 IMPLANT
TOWEL GREEN STERILE FF (TOWEL DISPOSABLE) ×2 IMPLANT
TRAY DSU PREP LF (CUSTOM PROCEDURE TRAY) ×1 IMPLANT
TUBE CONNECTING 20X1/4 (TUBING) ×1 IMPLANT
UNDERPAD 30X36 HEAVY ABSORB (UNDERPADS AND DIAPERS) ×2 IMPLANT
YANKAUER SUCT BULB TIP NO VENT (SUCTIONS) ×1 IMPLANT

## 2023-11-21 NOTE — Anesthesia Preprocedure Evaluation (Addendum)
 Anesthesia Evaluation  Patient identified by MRN, date of birth, ID band Patient awake    Reviewed: Allergy & Precautions, H&P , NPO status , Patient's Chart, lab work & pertinent test results  Airway Mallampati: I  TM Distance: >3 FB Neck ROM: Full    Dental no notable dental hx. (+) Teeth Intact, Dental Advisory Given   Pulmonary asthma    Pulmonary exam normal breath sounds clear to auscultation       Cardiovascular negative cardio ROS  Rhythm:Regular Rate:Normal     Neuro/Psych negative neurological ROS  negative psych ROS   GI/Hepatic negative GI ROS, Neg liver ROS,,,  Endo/Other  negative endocrine ROS    Renal/GU negative Renal ROS  negative genitourinary   Musculoskeletal   Abdominal   Peds  Hematology negative hematology ROS (+)   Anesthesia Other Findings   Reproductive/Obstetrics negative OB ROS                             Anesthesia Physical Anesthesia Plan  ASA: 2  Anesthesia Plan: General   Post-op Pain Management: Tylenol PO (pre-op)* and Toradol IV (intra-op)*   Induction: Intravenous  PONV Risk Score and Plan: 4 or greater and Ondansetron, Dexamethasone and Midazolam  Airway Management Planned: Oral ETT  Additional Equipment:   Intra-op Plan:   Post-operative Plan: Extubation in OR  Informed Consent: I have reviewed the patients History and Physical, chart, labs and discussed the procedure including the risks, benefits and alternatives for the proposed anesthesia with the patient or authorized representative who has indicated his/her understanding and acceptance.     Dental advisory given  Plan Discussed with: CRNA  Anesthesia Plan Comments:        Anesthesia Quick Evaluation

## 2023-11-21 NOTE — Transfer of Care (Signed)
 Immediate Anesthesia Transfer of Care Note  Patient: Rita Ross  Procedure(s) Performed: bilateral breast reduction (Bilateral)  Patient Location: PACU  Anesthesia Type:General  Level of Consciousness: awake, drowsy, and patient cooperative  Airway & Oxygen Therapy: Patient Spontanous Breathing and Patient connected to face mask oxygen  Post-op Assessment: Report given to RN and Post -op Vital signs reviewed and stable  Post vital signs: Reviewed and stable  Last Vitals:  Vitals Value Taken Time  BP 124/81 11/21/23 1416  Temp 36.3 C 11/21/23 1416  Pulse 96 11/21/23 1424  Resp 19 11/21/23 1424  SpO2 100 % 11/21/23 1424  Vitals shown include unfiled device data.  Last Pain:  Vitals:   11/21/23 0819  TempSrc: Temporal  PainSc: 0-No pain         Complications: No notable events documented.

## 2023-11-21 NOTE — Op Note (Signed)
 DATE OF OPERATION: 11/21/2023  LOCATION: [facility] operating Room  PREOPERATIVE DIAGNOSIS: Symptomatic macromastia  POSTOPERATIVE DIAGNOSIS: Same  PROCEDURE: Bilateral breast reduction  SURGEON: Loren Racer, MD  ASSISTANT: Matt Scheeler   EBL: 150 cc  CONDITION: Stable  COMPLICATIONS: None  INDICATION: The patient, Rita Ross, is a 23 y.o. female born on 12-Aug-2001, is here for treatment of upper back and neck pain secondary to large breast size.   PROCEDURE DETAILS:  The patient was seen prior to surgery and marked.  The IV antibiotics were given. The patient was taken to the operating room and given a general anesthetic. A standard time out was performed and all information was confirmed by those in the room. SCDs were placed.   The chest was prepped and draped in usual sterile manner for procedure. 42 mm cookie cutter was used to outline the proposed nipple areolar complexes bilaterally.  An 8 cm based inferior pedicle was outside of the breast.  A laparotomy tape was placed at the base of the breast on the right and the pedicle was de-epithelialized sharply.  Electrocautery was used to resect the medial lateral and superior triangles of breast tissue and to develop and thin the superior skin flap.The tissue resected constituted the bulk of the reduction and weighed 1125 grams.  The surgical wound was irrigated and hemostasis achieved with electrocautery.  Subfascial space of the pectoralis muscle and subcutaneous tissues were infiltrated with a mixture of quarter percent Marcaine, Exparel, and saline.  A total of 50 mL of this mixture was used.  A 19 French round drain was placed behind the pedicle and brought out through a separate stab incision.  The T point was approximated with a single 2-0 Monocryl suture and the skin edges tailor tacked in place with skin clips.  The dermis was closed in interrupted and running 3-0 Monocryl sutures and the skin incision was closed with  a running 4-0 Monocryl subcuticular stitch.  Attention was turned to the left breast where similar procedure was performed.  After placing a laparotomy tape at the base of breast tourniquet the pedicle was de-epithelialized sharply.  The electrocautery was used to dissect the borders of the pedicle down to the chest wall and the medial lateral and superior triangles of breast tissue were resected.  The superior skin flap was developed and thinned.  The breast tissue removed constituted the bulk of the reduction and weighed 1180 g.  All breast tissue was sent to pathology for routine examination.  After irrigating the breast and obtaining hemostasis the subfascial space and subcutaneous tissues were infiltrated with the Exparel mixture and a 19 French round drain was placed behind the pedicle and brought out through a separate stab incision.  The T point was approximated with a single 2-0 Monocryl suture and the dermis was closed with interrupted and running 3-0 Monocryl sutures.  The skin was closed with a running 4-0 Monocryl subcuticular stitch.  All incisions were then sealed with Dermabond.  The patient was placed in a supportive compressive garment awakened from anesthesia without incident.  She was transferred to the recovery room in good condition.  All instrument needle and sponge counts were reported as correct and no complications were noted during the procedure. The patient was allowed to wake up and taken to recovery room in stable condition at the end of the case. The family was notified at the end of the case.   The advanced practice practitioner (APP) assisted throughout the case.  The APP was essential in retraction and counter traction when needed to make the case progress smoothly.  This retraction and assistance made it possible to see the tissue plans for the procedure.  The assistance was needed for blood control, tissue re-approximation and assisted with closure of the incision site.

## 2023-11-21 NOTE — Anesthesia Postprocedure Evaluation (Signed)
 Anesthesia Post Note  Patient: Rita Ross  Procedure(s) Performed: bilateral breast reduction (Bilateral)     Patient location during evaluation: PACU Anesthesia Type: General Level of consciousness: awake and alert Pain management: pain level controlled Vital Signs Assessment: post-procedure vital signs reviewed and stable Respiratory status: spontaneous breathing, nonlabored ventilation and respiratory function stable Cardiovascular status: blood pressure returned to baseline and stable Postop Assessment: no apparent nausea or vomiting Anesthetic complications: no  No notable events documented.  Last Vitals:  Vitals:   11/21/23 1455 11/21/23 1500  BP:  104/69  Pulse: 93 91  Resp: 11 11  Temp:    SpO2: 98% 97%    Last Pain:  Vitals:   11/21/23 1500  TempSrc:   PainSc: 2                  Maleya Leever,W. EDMOND

## 2023-11-21 NOTE — Interval H&P Note (Signed)
 History and Physical Interval Note: No change in exam or indication for surgery All questions answered. Pt marked for a bilateral breast reduction with her assistance Will proceed at her request  11/21/2023 10:13 AM  Raynelle Fanning  has presented today for surgery, with the diagnosis of neck pain.  The various methods of treatment have been discussed with the patient and family. After consideration of risks, benefits and other options for treatment, the patient has consented to  Procedure(s): bilateral breast reduction (Bilateral) as a surgical intervention.  The patient's history has been reviewed, patient examined, no change in status, stable for surgery.  I have reviewed the patient's chart and labs.  Questions were answered to the patient's satisfaction.     Santiago Glad

## 2023-11-21 NOTE — Anesthesia Procedure Notes (Signed)
 Procedure Name: LMA Insertion Date/Time: 11/21/2023 10:32 AM  Performed by: Yolanda Bonine, CRNAPre-anesthesia Checklist: Patient identified, Emergency Drugs available, Suction available, Patient being monitored and Timeout performed Patient Re-evaluated:Patient Re-evaluated prior to induction Oxygen Delivery Method: Circle system utilized Preoxygenation: Pre-oxygenation with 100% oxygen Induction Type: IV induction Ventilation: Mask ventilation without difficulty LMA: LMA with gastric port inserted LMA Size: 4.0 Number of attempts: 1 Placement Confirmation: positive ETCO2 Tube secured with: Tape Dental Injury: Teeth and Oropharynx as per pre-operative assessment

## 2023-11-21 NOTE — Discharge Instructions (Addendum)
 INSTRUCTIONS FOR AFTER BREAST SURGERY   You will likely have some questions about what to expect following your operation.  The following information will help you and your family understand what to expect when you are discharged from the hospital.  It is important to follow these guidelines to help ensure a smooth recovery and reduce complication.  Postoperative instructions include information on: diet, wound care, medications and physical activity.  AFTER SURGERY Expect to go home after the procedure.  In some cases, you may need to spend one night in the hospital for observation.  DIET Breast surgery does not require a specific diet.  However, the healthier you eat the better your body will heal. It is important to increasing your protein intake.  This means limiting the foods with sugar and carbohydrates.  Focus on vegetables and some meat.  If you have liposuction during your procedure be sure to drink water.  If your urine is bright yellow, then it is concentrated, and you need to drink more water.  As a general rule after surgery, you should have 8 ounces of water every hour while awake.  If you find you are persistently nauseated or unable to take in liquids let us know.  NO TOBACCO USE or EXPOSURE.  This will slow your healing process and lead to a wound.  WOUND CARE Leave the binder on at all times except when showering . Use fragrance free soap like Dial, Dove or [place].   After 24 hours you can remove the binder to shower. Once dry apply binder or sports bra. No baths, pools or hot tubs for four weeks. We close your incision to leave the smallest and best-looking scar. No ointment or creams on your incisions for four weeks.  No Neosporin (Too many skin reactions).  A few weeks after surgery you can use Mederma and start massaging the scar. We ask you to wear your binder or sports bra for the first 6 weeks around the clock, including while sleeping. This provides added comfort and helps  reduce the fluid accumulation at the surgery site. NO Ice or heating pads to the operative site.  You have a very high risk of a BURN before you feel the temperature change. Continue to empty, recharge, & record drainage from drains 2-3 times a day, as needed.   ACTIVITY No heavy lifting until cleared by the doctor.  This usually means no more than a half-gallon of milk.  It is OK to walk and climb stairs. Moving your legs is very important to decrease your risk of a blood clot.  It will also help keep you from getting deconditioned.  Every 1 to 2 hours get up and walk for 5 minutes. This will help with a quicker recovery back to normal.  Let pain be your guide so you don't do too much.  This time is for you to recover.  You will be more comfortable if you sleep and rest with your head elevated either with a few pillows under you or in a recliner.  No stomach sleeping for a three months.  WORK Everyone returns to work at different times. As a rough guide, most people take at least 1 - 2 weeks off prior to returning to work. If you need documentation for your job, give the forms to the front staff at the clinic.  DRIVING Arrange for someone to bring you home from the hospital after your surgery.  You may be able to drive a few days  after surgery but not while taking any narcotics or valium.  BOWEL MOVEMENTS Constipation can occur after anesthesia and while taking pain medication.  It is important to stay ahead for your comfort.  We recommend taking Milk of Magnesia (2 tablespoons; twice a day) while taking the pain pills.  MEDICATIONS You may be prescribed should start after surgery At your preoperative visit for you history and physical you may have been given the following medications: Zofran 4 mg:  This is to treat nausea and vomiting.  You can take this every 6 hours as needed and only if needed. Oxycodone 5 mg:  This is only to be used after you have taken the Motrin or the Tylenol. Every 8  hours as needed.   Over the counter Medication to take: Ibuprofen (Motrin) 600 mg:  Take this every 6 hours.  If you have additional pain then take 500 mg of the Tylenol every 8 hours.  Only take the Norco after you have tried these two. MiraLAX or Milk of Magnesia: Take this according to the bottle if you take the Norco.  WHEN TO CALL Call your surgeon's office if any of the following occur: Fever 101 degrees F or greater Excessive bleeding or fluid from the incision site. Pain that increases over time without aid from the medications Redness, warmth, or pus draining from incision sites Persistent nausea or inability to take in liquids Severe misshapen area that underwent the operation.  Here are some resources for breast cancer patients:  Plastic surgery website: https://www.plasticsurgery.org/for-medical-professionals/education-and-resources/publications/breast-reconstruction-magazine Breast Reconstruction Awareness Campaign:  ChessContest[employer]fr Plastic surgery Implant information:  https://www.plasticsurgery.org/patient-safety/breast-implant-safety    Post Anesthesia Home Care Instructions  Activity: Get plenty of rest for the remainder of the day. A responsible individual must stay with you for 24 hours following the procedure.  For the next 24 hours, DO NOT: -Drive a car -Advertising copywriter -Drink alcoholic beverages -Take any medication unless instructed by your physician -Make any legal decisions or sign important papers.  Meals: Start with liquid foods such as gelatin or soup. Progress to regular foods as tolerated. Avoid greasy, spicy, heavy foods. If nausea and/or vomiting occur, drink only clear liquids until the nausea and/or vomiting subsides. Call your physician if vomiting continues.  Special Instructions/Symptoms: Your throat may feel dry or sore from the anesthesia or the breathing tube placed in your throat during surgery. If this causes  discomfort, gargle with warm salt water. The discomfort should disappear within 24 hours.  If you had a scopolamine patch placed behind your ear for the management of post- operative nausea and/or vomiting:  1. The medication in the patch is effective for 72 hours, after which it should be removed.  Wrap patch in a tissue and discard in the trash. Wash hands thoroughly with soap and water. 2. You may remove the patch earlier than 72 hours if you experience unpleasant side effects which may include dry mouth, dizziness or visual disturbances. 3. Avoid touching the patch. Wash your hands with soap and water after contact with the patch.  Information for Discharge Teaching: EXPAREL (bupivacaine liposome injectable suspension)   Pain relief is important to your recovery. The goal is to control your pain so you can move easier and return to your normal activities as soon as possible after your procedure. Your physician may use several types of medicines to manage pain, swelling, and more.  Your surgeon or anesthesiologist gave you EXPAREL(bupivacaine) to help control your pain after surgery.  EXPAREL is a local anesthetic  designed to release slowly over an extended period of time to provide pain relief by numbing the tissue around the surgical site. EXPAREL is designed to release pain medication over time and can control pain for up to 72 hours. Depending on how you respond to EXPAREL, you may require less pain medication during your recovery. EXPAREL can help reduce or eliminate the need for opioids during the first few days after surgery when pain relief is needed the most. EXPAREL is not an opioid and is not addictive. It does not cause sleepiness or sedation.   Important! A teal colored band has been placed on your arm with the date, time and amount of EXPAREL you have received. Please leave this armband in place for the full 96 hours following administration, and then you may remove the band. If  you return to the hospital for any reason within 96 hours following the administration of EXPAREL, the armband provides important information that your health care providers to know, and alerts them that you have received this anesthetic.    Possible side effects of EXPAREL: Temporary loss of sensation or ability to move in the area where medication was injected. Nausea, vomiting, constipation Rarely, numbness and tingling in your mouth or lips, lightheadedness, or anxiety may occur. Call your doctor right away if you think you may be experiencing any of these sensations, or if you have other questions regarding possible side effects.  Follow all other discharge instructions given to you by your surgeon or nurse. Eat a healthy diet and drink plenty of water or other fluids.  About my Jackson-Pratt Bulb Drain  What is a Jackson-Pratt bulb? A Jackson-Pratt is a soft, round device used to collect drainage. It is connected to a long, thin drainage catheter, which is held in place by one or two small stiches near your surgical incision site. When the bulb is squeezed, it forms a vacuum, forcing the drainage to empty into the bulb.  Emptying the Jackson-Pratt bulb- To empty the bulb: 1. Release the plug on the top of the bulb. 2. Pour the bulb's contents into a measuring container which your nurse will provide. 3. Record the time emptied and amount of drainage. Empty the drain(s) as often as your     doctor or nurse recommends.  Date                  Time                    Amount (Drain 1)                 Amount (Drain  2)  _____________________________________________________________________  _____________________________________________________________________  _____________________________________________________________________  _____________________________________________________________________  _____________________________________________________________________  _____________________________________________________________________  _____________________________________________________________________  _____________________________________________________________________  Squeezing the Jackson-Pratt Bulb- To squeeze the bulb: 1. Make sure the plug at the top of the bulb is open. 2. Squeeze the bulb tightly in your fist. You will hear air squeezing from the bulb. 3. Replace the plug while the bulb is squeezed. 4. Use a safety pin to attach the bulb to your clothing. This will keep the catheter from     pulling at the bulb insertion site.  When to call your doctor- Call your doctor if: Drain site becomes red, swollen or hot. You have a fever greater than 101 degrees F. There is oozing at the drain site. Drain falls out (apply a guaze bandage over the drain hole and secure it with tape). Drainage increases daily not related  to activity patterns. (You will usually have more drainage when you are active than when you are resting.) Drainage has a bad odor.   No tylenol until after 2:30pm if needed today.

## 2023-11-22 ENCOUNTER — Encounter (HOSPITAL_BASED_OUTPATIENT_CLINIC_OR_DEPARTMENT_OTHER): Payer: Self-pay | Admitting: Plastic Surgery

## 2023-11-24 ENCOUNTER — Encounter: Payer: Self-pay | Admitting: Student

## 2023-11-24 ENCOUNTER — Ambulatory Visit: Payer: MEDICAID | Admitting: Student

## 2023-11-24 VITALS — BP 124/77 | HR 113

## 2023-11-24 DIAGNOSIS — N62 Hypertrophy of breast: Secondary | ICD-10-CM

## 2023-11-24 LAB — SURGICAL PATHOLOGY

## 2023-11-24 NOTE — Progress Notes (Signed)
 Patient is a 23 year old female who underwent bilateral breast reduction with Dr. Ladona Ridgel on 11/21/2023.  Intraoperatively, patient had 1125 g removed from the right breast and 1180 g removed from the left breast.  Patient is 3 days postop.  She presents to the clinic today for postoperative follow-up.  Today, patient reports she is doing well.  She states she has a little bit of soreness to her breast when she wakes up, but otherwise reports that her pain has been controlled.  She states she is only had to take 2 oxycodone since surgery, otherwise has only been taking Tylenol.  Patient states that the day of surgery, she was nauseous.  She says after that, she has been able to eat and drink without any issue.  She reports she has been ambulating without any difficulties.  She denies any dizziness, lightheadedness, chest pain, shortness of breath.  She reports she has been voiding without any difficulty.  Patient's drain output has been approximately 20 cc in the past 24 hours from each drain.  Patient is slightly tachycardic at today's visit.  Her blood pressure and her SpO2 are stable.  Chaperone present on exam.  On exam, patient is sitting upright in no acute distress.  Breasts are soft and fairly symmetric.  There is no overlying erythema to either breast.  No obvious fluid collections palpated on exam.  NAC's appear to be healthy bilaterally.  Sensations intact to the NAC's bilaterally.  Incisions are clean dry and intact.  There is minimal clear serosanguineous drainage in each of the bulbs.  There were no signs of infection on exam.  Lower extremities are symmetric and nonswollen.  There is no tenderness to palpation to either lower extremity.  Drains were removed without any difficulty.  Patient tolerated well.  Discussed with patient that her drain sites will most likely drain over the next few days.  Recommended that she apply gauze and tape over the drain sites daily and then starting on  Wednesday or Thursday, she may apply Vaseline over the drain sites.  Patient expressed understanding.  Discussed with patient that her mild tachycardia may be related to dehydration.  Recommended that she continue to drink plenty of water and fluids.  Discussed with her that if she starts developing any other symptoms to call us.  Patient expressed understanding.  Discussed with patient to continue to wear compression at all times.  Patient to follow-up next week at her scheduled appointment.  Instructed her to call if she has any questions or concerns about anything.

## 2023-11-28 ENCOUNTER — Telehealth: Payer: Self-pay | Admitting: Plastic Surgery

## 2023-11-28 NOTE — Telephone Encounter (Signed)
 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 Pt stated she will not be doing any pulling, pushing, or lifting. I spoke with Dr Ladona Ridgel and he was ok with it as long as she was ok her sx was 11-21-23, she promised no  pulling, pushing, or lifting.

## 2023-12-01 ENCOUNTER — Ambulatory Visit (INDEPENDENT_AMBULATORY_CARE_PROVIDER_SITE_OTHER): Payer: MEDICAID | Admitting: Plastic Surgery

## 2023-12-01 VITALS — BP 120/77 | HR 98

## 2023-12-01 DIAGNOSIS — Z9889 Other specified postprocedural states: Secondary | ICD-10-CM

## 2023-12-02 ENCOUNTER — Encounter: Payer: Self-pay | Admitting: Plastic Surgery

## 2023-12-03 NOTE — Telephone Encounter (Signed)
 I called the patient in regards to her MyChart message.  Discussed with her that she may use Aquaphor on her NAC's and her incisions.  In terms of the fold in between her breast, I recommended that she gently massage the area 1-2 times daily.  Discussed with her to make sure she is keeping the area clean clean.  Discussed with her she may put a small amount of Aquaphor or gentle moisturizer in the area if she feels like there is some dryness there.  Patient expressed understanding.  All of her questions were answered to her satisfaction.

## 2023-12-03 NOTE — Progress Notes (Signed)
 Rita Ross returns today approximately 10 days postop from a bilateral breast reduction.  She is doing well with no specific complaints and is overall happy with results.  On examination she has nice shape and symmetry the nipples are warm and well-perfused.  All incisions are clean dry and intact.   We discussed scar massage beginning at 2 weeks.   Keep her scheduled follow-up appointment and return sooner for any questions or concerns.

## 2023-12-10 ENCOUNTER — Ambulatory Visit (INDEPENDENT_AMBULATORY_CARE_PROVIDER_SITE_OTHER): Payer: MEDICAID | Admitting: Surgical

## 2023-12-10 ENCOUNTER — Ambulatory Visit: Payer: MEDICAID | Admitting: Surgical

## 2023-12-10 VITALS — BP 118/80 | HR 90

## 2023-12-10 DIAGNOSIS — N62 Hypertrophy of breast: Secondary | ICD-10-CM

## 2023-12-10 DIAGNOSIS — Z9889 Other specified postprocedural states: Secondary | ICD-10-CM

## 2023-12-10 DIAGNOSIS — M545 Low back pain, unspecified: Secondary | ICD-10-CM

## 2023-12-10 DIAGNOSIS — M546 Pain in thoracic spine: Secondary | ICD-10-CM

## 2023-12-10 NOTE — Progress Notes (Signed)
 23 year old female here for follow-up after bilateral breast reduction with Dr. Ladona Ridgel on 11/21/2023.  She is here today for evaluation of left leg pain.  She reports that she has noticed some occasional left anterior leg pain just adjacent to her knee on the front part of her leg.  She reports that she has been active, has not been too sedentary.  She has no personal or family history of blood clots per her knowledge.  She has not noticed any swelling of her lower extremities.  She is not having any shortness of breath or chest pain.  She reports in regards to her breast surgery she is overall doing well, pain is well-controlled.  She has noticed some drainage of the left breast T-junction onto her gauze.   Chaperone present on exam BP 118/80 (BP Location: Left Arm, Patient Position: Sitting, Cuff Size: Normal)   Pulse 90   LMP 11/10/2023 (Exact Date)   SpO2 100%  Patient is well-developed, well-nourished, no acute distress.  Vital signs are stable. Bilateral NAC's are viable, bilateral breast incisions are intact. There is no erythema or cellulitic changes noted. No obvious subcutaneous fluid collections noted with palpation.  Left lower extremity is without swelling, tenderness.  Tenderness is not reproducible.   Patient points to location of pain which is just over the tibial tuberosity on the anterior left leg.  Bilateral lower extremity are symmetric.  No tenderness or pain noted with palpation of posterior calf or any other part of her leg.  No erythema noted.  A/P:  Patient is doing really well in regards to recovery of her breast reduction.  She did present today for concerns over left leg pain.  Physical exam is benign.  Discussed with patient that if she notices increased swelling of her leg, increased pain or if pain travels up her leg or she develops pain in the posterior area of her leg to please notify our office immediately.  Discussed with patient to continue to monitor her  symptoms and notify us of any changes.  We discussed recommendations for using ibuprofen, suspect may be musculoskeletal in nature, ibuprofen recommended.  Recommend following up with PCP if symptoms do not resolve in a few days or she continues to notice worsening.  Recommend continuing with compressive garment 24/7 until 6 weeks post-op,  avoiding strenuous activity/heavy lifting until 6 weeks post-op  All of the patient's questions were answered to their content.  Recommend following up in about 3 to 4 weeks in regards to her breast reduction. Recommend calling with any questions or concerns.

## 2023-12-11 ENCOUNTER — Encounter: Payer: MEDICAID | Admitting: Student

## 2023-12-12 ENCOUNTER — Encounter: Payer: MEDICAID | Admitting: Student

## 2023-12-18 ENCOUNTER — Ambulatory Visit: Payer: MEDICAID | Admitting: Podiatry

## 2023-12-19 ENCOUNTER — Ambulatory Visit (INDEPENDENT_AMBULATORY_CARE_PROVIDER_SITE_OTHER): Payer: MEDICAID | Admitting: Podiatry

## 2023-12-19 ENCOUNTER — Encounter: Payer: Self-pay | Admitting: Podiatry

## 2023-12-19 DIAGNOSIS — L600 Ingrowing nail: Secondary | ICD-10-CM | POA: Diagnosis not present

## 2023-12-19 MED ORDER — MUPIROCIN 2 % EX OINT: 1.0000 | TOPICAL_OINTMENT | Freq: Two times a day (BID) | CUTANEOUS | 2 refills | Status: DC

## 2023-12-19 NOTE — Patient Instructions (Signed)
 Soak Instructions    THE DAY AFTER THE PROCEDURE  Place 1/4 cup of epsom salts in a quart of warm tap water.  Submerge your foot or feet with outer bandage intact for the initial soak; this will allow the bandage to become moist and wet for easy lift off.  Once you remove your bandage, continue to soak in the solution for 20 minutes.  This soak should be done twice a day.  Next, remove your foot or feet from solution, blot dry the affected area and cover.  You may use a band aid large enough to cover the area or use gauze and tape.  Apply other medications to the area as directed by the doctor such as polysporin neosporin.  IF YOUR SKIN BECOMES IRRITATED WHILE USING THESE INSTRUCTIONS, IT IS OKAY TO SWITCH TO  WHITE VINEGAR AND WATER. Or you may use antibacterial soap and water to keep the toe clean  Monitor for any signs/symptoms of infection. Call the office immediately if any occur or go directly to the emergency room. Call with any questions/concerns.  --   Ingrown Toenail  An ingrown toenail occurs when the corner or sides of a toenail grow into the surrounding skin. This causes discomfort and pain. The big toe is most commonly affected, but any of the toes can be affected. If an ingrown toenail is not treated, it can become infected. What are the causes? This condition may be caused by: Wearing shoes that are too small or tight. An injury, such as stubbing your toe or having your toe stepped on. Improper cutting or care of your toenails. Having nail or foot abnormalities that were present from birth (congenital abnormalities), such as having a nail that is too big for your toe. What increases the risk? The following factors may make you more likely to develop ingrown toenails: Age. Nails tend to get thicker with age, so ingrown nails are more common among older people. Cutting your toenails incorrectly, such as cutting them very short or cutting them unevenly. An ingrown toenail is  more likely to get infected if you have: Diabetes. Blood flow (circulation) problems. What are the signs or symptoms? Symptoms of an ingrown toenail may include: Pain, soreness, or tenderness. Redness. Swelling. Hardening of the skin that surrounds the toenail. Signs that an ingrown toenail may be infected include: Fluid or pus. Symptoms that get worse. How is this diagnosed? Ingrown toenails may be diagnosed based on: Your symptoms and medical history. A physical exam. Labs or tests. If you have fluid or blood coming from your toenail, a sample may be collected to test for the specific type of bacteria that is causing the infection. How is this treated? Treatment depends on the severity of your symptoms. You may be able to care for your toenail at home. If you have an infection, you may be prescribed antibiotic medicines. If you have fluid or pus draining from your toenail, your health care provider may drain it. If you have trouble walking, you may be given crutches to use. If you have a severe or infected ingrown toenail, you may need a procedure to remove part or all of the nail. Follow these instructions at home: Foot care  Check your wound every day for signs of infection, or as often as told by your health care provider. Check for: More redness, swelling, or pain. More fluid or blood. Warmth. Pus or a bad smell. Do not pick at your toenail or try to remove it  yourself. Soak your foot in warm, soapy water. Do this for 20 minutes, 3 times a day, or as often as told by your health care provider. This helps to keep your toe clean and your skin soft. Wear shoes that fit well and are not too tight. Your health care provider may recommend that you wear open-toed shoes while you heal. Trim your toenails regularly and carefully. Cut your toenails straight across to prevent injury to the skin at the corners of the toenail. Do not cut your nails in a curved shape. Keep your feet clean  and dry to help prevent infection. General instructions Take over-the-counter and prescription medicines only as told by your health care provider. If you were prescribed an antibiotic, take it as told by your health care provider. Do not stop taking the antibiotic even if you start to feel better. If your health care provider told you to use crutches to help you move around, use them as instructed. Return to your normal activities as told by your health care provider. Ask your health care provider what activities are safe for you. Keep all follow-up visits. This is important. Contact a health care provider if: You have more redness, swelling, pain, or other symptoms that do not improve with treatment. You have fluid, blood, or pus coming from your toenail. You have a red streak on your skin that starts at your foot and spreads up your leg. You have a fever. Summary An ingrown toenail occurs when the corner or sides of a toenail grow into the surrounding skin. This causes discomfort and pain. The big toe is most commonly affected, but any of the toes can be affected. If an ingrown toenail is not treated, it can become infected. Fluid or pus draining from your toenail is a sign of infection. Your health care provider may need to drain it. You may be given antibiotics to treat the infection. Trimming your toenails regularly and properly can help you prevent an ingrown toenail. This information is not intended to replace advice given to you by your health care provider. Make sure you discuss any questions you have with your health care provider. Document Revised: 12/26/2020 Document Reviewed: 12/26/2020 Elsevier Patient Education  2024 [employer].

## 2023-12-20 NOTE — Progress Notes (Signed)
  Subjective:  Patient ID: Rita Ross, female    DOB: Feb 21, 2001,  MRN: [id number]  Chief Complaint  Patient presents with   Ingrown Toenail    RM#13 Left foot big toe ingrown nail with recent infection no antibiotic treatment taken.    Discussed the use of AI scribe software for clinical note transcription with the patient, who gave verbal consent to proceed.  History of Present Illness The patient presents with left big toe pain and swelling, which she noticed after showering. She previously reports a small amount of pus discharge from the area, but denies any pain in the right big toe. The left toe pain is mild and is exacerbated by wearing shoes. She has attempted home treatment with Vaseline, but has not taken any antibiotics or other medications for the issue. The patient also reports a history of self-manicuring her toenails, which she believes may have contributed to the issue. She has an upcoming graduation and trip, and expresses a desire for the issue to be resolved before these events.      Objective:    Physical Exam General: AAO x3, NAD  Dermatological: Mild incurvation present left hallux toenail with slight edema but there is no drainage or pus identified today.  There is no erythema or signs of infection.  Small piece of the nail remains in the skin distally on the hallux toenail and this is where she has majority discomfort.  No open lesions.  Vascular: Dorsalis Pedis artery and Posterior Tibial artery pedal pulses are 2/4 bilateral with immedate capillary fill time.  There is no pain with calf compression, swelling, warmth, erythema.   Neruologic: Grossly intact via light touch bilateral.   Musculoskeletal: No other areas of discomfort. Gait: Unassisted, Nonantalgic.      No images are attached to the encounter.    Results    Assessment:   1. Ingrown toenail      Plan:  Patient was evaluated and treated and all questions  answered.  Assessment and Plan Assessment & Plan Ingrown toenail with possible infection Ingrown toenail on left hallux with swelling and previously noted purulent drainage, indicating possible infection.  Unable to appreciate any drainage currently.  Discussed conservative management versus surgical intervention. She opted for conservative management due to upcoming graduation and travel. - Trim nail to remove ingrown portion and clean area.  Upon debridement there is no drainage or pus.  Some slight edema which I think is more from inflammation as opposed to infection.- Provide Epsom salt soaking instructions. - Prescribe antibiotic ointment for twice-daily application. - Advise on oral antibiotics if drainage or pus reoccurs. - Discuss surgical intervention if conservative measures fail.   Return if symptoms worsen or fail to improve.   Charity Conch DPM

## 2023-12-25 ENCOUNTER — Telehealth: Payer: Self-pay | Admitting: Plastic Surgery

## 2023-12-25 NOTE — Telephone Encounter (Signed)
 Spoke with patient, answered her questions.  She is overall doing well, discussed Vaseline and gauze over the right breast wound/scabbing area.  In regards to her rash, unable to provide much guidance to patient due to inability to see the rash.  Discussed with her to keep the area dry, she can use Aquaphor or Vaseline to help soothe the area.  She does not recall any changes to her bathing routine or laundry routine that would explain the new rash on her upper chest.  Discussed with patient if the rash worsens prior to her appointment in 1 week to please notify us  to be evaluated.

## 2023-12-25 NOTE — Telephone Encounter (Signed)
 Patient says she discharge under your right nipple and would like someone to call her, she has class until 10:45a

## 2024-01-01 ENCOUNTER — Ambulatory Visit: Payer: MEDICAID | Admitting: Student

## 2024-01-01 ENCOUNTER — Encounter: Payer: Self-pay | Admitting: Student

## 2024-01-01 VITALS — BP 116/77 | HR 82 | Ht 65.0 in | Wt 162.0 lb

## 2024-01-01 DIAGNOSIS — Z9889 Other specified postprocedural states: Secondary | ICD-10-CM

## 2024-01-01 NOTE — Progress Notes (Signed)
 Patient is a 23 year old female who underwent bilateral breast reduction with Dr. Carolynne Citron on 11/21/2023.  She is almost 6 weeks postop.  Patient presents to the clinic today for postoperative follow-up.  Patient was last seen in the clinic on 12/10/2023.  At this visit, patient complained of left leg pain.  On exam, bilateral NAC's were viable, incisions were intact.  Left lower extremity was noted to have no swelling or tenderness.  Today, patient reports she is doing well.  She states that she is bothered by the appearance of her medial breasts bilaterally.  She states that there is still folding/wrinkling of the skin that she is bothered by.  She states that she has been gently massaging this.  She otherwise has no complaints or concerns.  She denies any fevers or chills.  Chaperone present on exam.  On exam, patient is sitting upright in no acute distress.  Breasts are overall soft and symmetric.  There is a little bit of firmness noted to the medial right breast, this appears to be consistent with scar tissue or possibly fat necrosis.  There is no overlying erythema.  No fluid collections palpated on exam.  Incisions appear to be intact and healing well.  There appears to be a small bump noted to the inframammary incision on the right breast consistent with a suture underneath the skin.  There is no drainage, fluctuance or signs of infection.  There is some excess/wrinkling skin noted medially.  It is somewhat firm, consistent with scar tissue.  I recommended that patient massage the wrinkling at least twice a day.  Discussed with her that her breasts should continue to settle out over the next few weeks.  I did discuss with her though that I would like her to follow back up with Dr. Carolynne Citron in regards to this concern.  Patient expressed understanding.  Discussed with patient she no longer needs to wear a sports bra may transition into a regular bra without underwire.  Discussed with patient she may  start increasing her activities and that she no longer has restrictions.  Patient has been using silicone scar tapes, and she may continue to use these.  Patient to follow back up in 1 month.  Instructed her to call in the meantime if she has questions or concerns about anything.  Pictures were obtained of the patient and placed in the chart with the patient's or guardian's permission.

## 2024-01-03 ENCOUNTER — Ambulatory Visit
Admission: RE | Admit: 2024-01-03 | Discharge: 2024-01-03 | Disposition: A | Discharge status: Home / Self Care | Payer: MEDICAID | Source: Ambulatory Visit | Attending: Physician Assistant

## 2024-01-03 VITALS — BP 120/80 | HR 71 | Temp 98.4°F | Resp 16

## 2024-01-03 DIAGNOSIS — B3731 Acute candidiasis of vulva and vagina: Secondary | ICD-10-CM | POA: Diagnosis present

## 2024-01-03 MED ORDER — FLUCONAZOLE 150 MG PO TABS: 150.0000 mg | ORAL_TABLET | Freq: Every day | ORAL | 0 refills | Status: DC

## 2024-01-03 NOTE — ED Provider Notes (Signed)
 [facility]    CSN: [id number] Arrival date & time: 01/03/24  0940      History   Chief Complaint Chief Complaint  Patient presents with   Vaginal Discharge    I believe I had a yeast infection - Entered by patient    HPI Rita Ross is a 23 y.o. female.   Patient here concerned with "vaginal yeast infection" x several days.  Admits thick white discharge, states she has a "yeast" smell.  No sexual activity x 2 years, but she would like STD testing as well.  S/p breast reduction surgery 6 weeks ago.  She has had a recent change in detergents.    Past Medical History:  Diagnosis Date   Asthma    Breast hypertrophy     There are no active problems to display for this patient.   Past Surgical History:  Procedure Laterality Date   BREAST REDUCTION SURGERY Bilateral 11/21/2023   Procedure: bilateral breast reduction;  Surgeon: Teretha Ferguson, MD;  Location: [facility];  Service: Plastics;  Laterality: Bilateral;   TONSILLECTOMY     WISDOM TOOTH EXTRACTION      OB History   No obstetric history on file.      Home Medications    Prior to Admission medications   Medication Sig Start Date End Date Taking? Authorizing Provider  fluconazole  (DIFLUCAN ) 150 MG tablet Take 1 tablet (150 mg total) by mouth daily. 01/03/24  Yes Lavonia Powers, PA-C  Azelaic Acid 15 % gel Apply topically daily. 04/28/23   [provider]  azelastine (ASTELIN) 0.1 % nasal spray SMARTSIG:2 Spray(s) Both Nares Twice Daily PRN 01/02/22   [provider]  cetirizine (ZYRTEC) 10 MG tablet Take 10 mg by mouth daily. 01/02/22   [provider]  fluticasone (FLONASE) 50 MCG/ACT nasal spray Administer one spray into each nostril ONCE daily. 08/12/23 08/11/24  [provider]  hydrocortisone 2.5 % ointment Apply topically. Apply 2 (two) times a day as needed for rash. 07/17/21   [provider]  mupirocin  ointment (BACTROBAN ) 2 %  Apply 1 Application topically 2 (two) times daily. 12/19/23   Charity Conch, DPM  ondansetron  (ZOFRAN ) 4 MG tablet Take 1 tablet (4 mg total) by mouth every 8 (eight) hours as needed for up to 20 doses for nausea or vomiting. Patient not taking: Reported on 12/19/2023 10/28/23   Harden Leyden, PA-C  oxyCODONE  (ROXICODONE ) 5 MG immediate release tablet Take 1 tablet (5 mg total) by mouth every 6 (six) hours as needed for up to 20 doses for severe pain (pain score 7-10). Patient not taking: Reported on 12/19/2023 10/28/23   Harden Leyden, PA-C  RETIN-A 0.025 % cream SMARTSIG:Sparingly Topical Every Night 04/25/23   [provider]  SIMPESSE 0.15-0.03 &0.01 MG tablet Take 1 tablet by mouth daily. 12/04/20   [provider]  VENTOLIN HFA 108 (90 Base) MCG/ACT inhaler 2 puffs every 4 (four) hours as needed for wheezing or shortness of breath. 05/13/23 05/12/24  [provider]    Family History Family History  Problem Relation Age of Onset   Healthy Mother    Healthy Father     Social History Social History   Tobacco Use   Smoking status: Never   Smokeless tobacco: Never  Vaping Use   Vaping status: Never Used  Substance Use Topics   Alcohol use: Never   Drug use: Never     Allergies   Amoxicillin, American  cockroach, Dust mite extract, and Molds & smuts   Review of Systems Review of Systems  Constitutional:  Negative for chills, fatigue and fever.  Gastrointestinal:  Negative for abdominal pain, diarrhea, nausea and vomiting.  Genitourinary:  Positive for vaginal discharge. Negative for decreased urine volume, difficulty urinating, dyspareunia, dysuria, flank pain, frequency, hematuria, menstrual problem, pelvic pain, urgency and vaginal bleeding.       - vaginal itching   Musculoskeletal:  Negative for arthralgias, back pain and myalgias.  Skin:  Negative for color change and rash.  Neurological:  Negative for seizures and syncope.   Psychiatric/Behavioral:  Negative for sleep disturbance. The patient is not nervous/anxious.   All other systems reviewed and are negative.    Physical Exam Triage Vital Signs ED Triage Vitals  Encounter Vitals Group     BP 01/03/24 0942 120/80     Systolic BP Percentile --      Diastolic BP Percentile --      Pulse Rate 01/03/24 0942 71     Resp 01/03/24 0942 16     Temp 01/03/24 0942 98.4 F (36.9 C)     Temp Source 01/03/24 0942 Oral     SpO2 01/03/24 0942 94 %     Weight --      Height --      Head Circumference --      Peak Flow --      Pain Score 01/03/24 0945 0     Pain Loc --      Pain Education --      Exclude from Growth Chart --    No data found.  Updated Vital Signs BP 120/80 (BP Location: Right Arm)   Pulse 71   Temp 98.4 F (36.9 C) (Oral)   Resp 16   LMP 12/18/2023 (Approximate)   SpO2 94%   Visual Acuity Right Eye Distance:   Left Eye Distance:   Bilateral Distance:    Right Eye Near:   Left Eye Near:    Bilateral Near:     Physical Exam Vitals and nursing note reviewed.  Constitutional:      General: She is not in acute distress.    Appearance: Normal appearance. She is well-developed. She is not ill-appearing.  HENT:     Head: Normocephalic and atraumatic.     Nose: Nose normal. No rhinorrhea.  Eyes:     General: No scleral icterus.    Extraocular Movements: Extraocular movements intact.     Conjunctiva/sclera: Conjunctivae normal.  Pulmonary:     Effort: Pulmonary effort is normal. No respiratory distress.  Abdominal:     General: Bowel sounds are normal.     Palpations: Abdomen is soft.     Tenderness: There is no abdominal tenderness. There is no right CVA tenderness, left CVA tenderness or guarding.  Musculoskeletal:     Cervical back: Normal range of motion and neck supple. No rigidity.  Skin:    General: Skin is warm and dry.  Neurological:     General: No focal deficit present.     Mental Status: She is alert and  oriented to person, place, and time.     Motor: No weakness.     Gait: Gait normal.  Psychiatric:        Mood and Affect: Mood normal.        Behavior: Behavior normal.      UC Treatments / Results  Labs (all labs ordered are listed, but only abnormal results are displayed) Labs  Reviewed  CERVICOVAGINAL ANCILLARY ONLY    EKG   Radiology No results found.  Procedures Procedures (including critical care time)  Medications Ordered in UC Medications - No data to display  Initial Impression / Assessment and Plan / UC Course  I have reviewed the triage vital signs and the nursing notes.  Pertinent labs & imaging results that were available during my care of the patient were reviewed by me and considered in my medical decision making (see chart for details).     Labs available via mychart in 2 - 5 days Take medication as prescribed We will call with results if they are positive or if they require change in medication Final Clinical Impressions(s) / UC Diagnoses   Final diagnoses:  Vaginal yeast infection     Discharge Instructions      Labs will be available via mychart in 2 - 5 days    ED Prescriptions     Medication Sig Dispense Auth. Provider   fluconazole  (DIFLUCAN ) 150 MG tablet Take 1 tablet (150 mg total) by mouth daily. 1 tablet Lavonia Powers, PA-C      PDMP not reviewed this encounter.   Lavonia Powers, PA-C 01/03/24 [phone]

## 2024-01-03 NOTE — Discharge Instructions (Addendum)
 Labs will be available via mychart in 2 - 5 days

## 2024-01-03 NOTE — ED Triage Notes (Signed)
 Pt states she thinks she has yeast infection. She has had discharge when wiping.  She recently had breast surgery and change in detergent that could have contributed to symptoms.

## 2024-01-05 LAB — CERVICOVAGINAL ANCILLARY ONLY
Bacterial Vaginitis (gardnerella): NEGATIVE
Candida Glabrata: POSITIVE — AB
Candida Vaginitis: POSITIVE — AB
Chlamydia: NEGATIVE
Comment: NEGATIVE
Comment: NEGATIVE
Comment: NEGATIVE
Comment: NEGATIVE
Comment: NEGATIVE
Comment: NORMAL
Neisseria Gonorrhea: NEGATIVE
Trichomonas: NEGATIVE

## 2024-01-08 ENCOUNTER — Encounter: Payer: Self-pay | Admitting: Plastic Surgery

## 2024-02-04 ENCOUNTER — Ambulatory Visit (INDEPENDENT_AMBULATORY_CARE_PROVIDER_SITE_OTHER): Payer: MEDICAID | Admitting: Plastic Surgery

## 2024-02-04 DIAGNOSIS — Z9889 Other specified postprocedural states: Secondary | ICD-10-CM

## 2024-02-04 NOTE — Progress Notes (Signed)
 Rita Ross returns today approximately 2 months postop from a bilateral breast reduction.  She is overall happy with the results however she does have some fullness between the breast and webbing that she does not like.  We discussed the reduction.  Patient had breasts which were extremely close and extremely ptotic.  Some webbing after the procedure is not unexpected.  I do believe that I can improve some of what she is concerned about with a scar revision on the medial aspect of the left breast.  I have asked her to wait the usual 6 months before we plan any type of scar revision.  She is happy with this and will return to see me in September.

## 2024-06-07 ENCOUNTER — Ambulatory Visit: Payer: MEDICAID | Admitting: Plastic Surgery

## 2024-06-07 ENCOUNTER — Encounter: Payer: Self-pay | Admitting: Plastic Surgery

## 2024-06-07 VITALS — BP 123/80 | HR 95 | Ht 65.0 in | Wt 153.0 lb

## 2024-06-07 DIAGNOSIS — L905 Scar conditions and fibrosis of skin: Secondary | ICD-10-CM

## 2024-06-07 DIAGNOSIS — Z9889 Other specified postprocedural states: Secondary | ICD-10-CM

## 2024-06-07 NOTE — Progress Notes (Signed)
 Rita Ross returns approximately 6 months postop from bilateral breast reduction.  She is overall happy with the results but would like to have the medial portion of both scars revised to help with the webbing.  On examination her shape and symmetry is quite nice all incisions are healed.  She does have some medial webbing of the incisions which I believe could be addressed with scar revision.  I showed her the change in the scars when she is laying down and how this may affect the overall outcome.  Will plan on scar revision in the operating room.  All questions answered to her satisfaction.  Photographs obtained today with her consent.  Will schedule her for surgery at her request.

## 2024-06-15 ENCOUNTER — Telehealth: Payer: Self-pay | Admitting: Plastic Surgery

## 2024-06-15 NOTE — Telephone Encounter (Signed)
 Patient returned melissa's call to schedule surgery

## 2024-06-21 ENCOUNTER — Encounter: Payer: MEDICAID | Admitting: Student

## 2024-06-22 ENCOUNTER — Other Ambulatory Visit: Payer: Self-pay

## 2024-06-22 ENCOUNTER — Encounter: Payer: Self-pay | Admitting: Student

## 2024-06-22 ENCOUNTER — Ambulatory Visit (INDEPENDENT_AMBULATORY_CARE_PROVIDER_SITE_OTHER): Payer: MEDICAID | Admitting: Student

## 2024-06-22 ENCOUNTER — Encounter (HOSPITAL_BASED_OUTPATIENT_CLINIC_OR_DEPARTMENT_OTHER): Payer: Self-pay | Admitting: Plastic Surgery

## 2024-06-22 VITALS — BP 117/69 | HR 78 | Ht 69.0 in | Wt 160.6 lb

## 2024-06-22 DIAGNOSIS — Z9889 Other specified postprocedural states: Secondary | ICD-10-CM

## 2024-06-22 DIAGNOSIS — L905 Scar conditions and fibrosis of skin: Secondary | ICD-10-CM

## 2024-06-22 MED ORDER — ONDANSETRON HCL 4 MG PO TABS: 4.0000 mg | ORAL_TABLET | Freq: Three times a day (TID) | ORAL | 0 refills | Status: AC | PRN

## 2024-06-22 NOTE — Progress Notes (Signed)
 Patient ID: Rita Ross, female    DOB: 2000/12/22, 23 y.o.   MRN: [id number]  Chief Complaint  Patient presents with   Pre-op Exam    preop:bilateral breast scar revision      ICD-10-CM   1. Scar  L90.5     2. S/P bilateral breast reduction  Z98.890        History of Present Illness: Rita Ross is a 23 y.o.  female  with a history of breast reduction.  She presents for preoperative evaluation for upcoming procedure, bilateral breast scar revision, scheduled for 06/29/2024 with Dr. Waddell.  The patient has not had problems with anesthesia.  Patient states that she has never needed a mammogram before.  She reports that her cousin had breast cancer.  She denies any history of cardiac disease.  She denies taking any blood thinners.  Patient reports she is not a smoker.  Patient does report she takes birth control.  She denies any history of miscarriages.  She denies any personal or family history of blood clots or clotting diseases.  She denies any recent surgeries, traumas or infections.  She denies any history of stroke or heart attack.  She denies any history of Crohn's disease or ulcerative colitis.  Patient does report that she has pulmonary issues, but has not had a flareup since July.  She states that she moved apartments and her pulmonologist felt her flareup was due to a new environment.  She states that she has been following up with her pulmonologist and PCP and does not report any issues since July.  She denies any history of cancer.  She denies any varicosities to her lower extremities.  She denies any recent fevers, chills or changes in her health.  Patient today did have some more questions about upcoming surgery and her incisions.  We did discuss the procedure, but I did recommend she talk with Dr. Waddell about some of the questions she had in regards to her upcoming surgery.  She expressed understanding was in agreement with this.  Summary of Previous  Visit: Patient is a 23 year old female who underwent bilateral breast reduction with Dr. Waddell on 11/21/2023.  She was then seen by Dr. Waddell on 06/07/2024.  At this visit, patient reported she is overall happy with the results, but would like to have the medial portion of both scars revised to help with the webbing.  On exam, patient had nice shape and symmetry in all the incisions were healed.  There was some medial webbing of the incisions which could be addressed with scar revision.  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 discussed with patient that she should not work while taking any pain medication.  She expressed understanding.  PMH Significant for: Allergic rhinitis, status post bilateral breast reduction   Past Medical History: Allergies: Allergies  Allergen Reactions   Amoxicillin Hives    Drowsiness   American Cockroach     Reaction unknown.   Dust Mite Extract Other (See Comments)    Pressure in ears, stuffy nose and mucus drip in throat    Molds & Smuts     Current Medications:  Current Outpatient Medications:    Azelaic Acid 15 % gel, Apply topically daily., Disp: , Rfl:    azelastine (ASTELIN) 0.1 % nasal spray, SMARTSIG:2 Spray(s) Both Nares Twice Daily PRN, Disp: , Rfl:    cetirizine (ZYRTEC) 10 MG  tablet, Take 10 mg by mouth daily., Disp: , Rfl:    fluticasone (FLONASE) 50 MCG/ACT nasal spray, Administer one spray into each nostril ONCE daily., Disp: , Rfl:    hydrocortisone 2.5 % ointment, Apply topically. Apply 2 (two) times a day as needed for rash., Disp: , Rfl:    mupirocin  ointment (BACTROBAN ) 2 %, Apply 1 Application topically 2 (two) times daily., Disp: 30 g, Rfl: 2   RETIN-A 0.025 % cream, SMARTSIG:Sparingly Topical Every Night, Disp: , Rfl:    SIMPESSE 0.15-0.03 &0.01 MG tablet, Take 1 tablet by mouth daily., Disp: , Rfl:    VENTOLIN HFA 108 (90 Base) MCG/ACT inhaler, 2 puffs every 4  (four) hours as needed for wheezing or shortness of breath., Disp: , Rfl:   Past Medical Problems: Past Medical History:  Diagnosis Date   Asthma    Breast hypertrophy     Past Surgical History: Past Surgical History:  Procedure Laterality Date   BREAST REDUCTION SURGERY Bilateral 11/21/2023   Procedure: bilateral breast reduction;  Surgeon: Waddell Leonce NOVAK, MD;  Location: [facility];  Service: Plastics;  Laterality: Bilateral;   TONSILLECTOMY     WISDOM TOOTH EXTRACTION      Social History: Social History   Socioeconomic History   Marital status: Single    Spouse name: Not on file   Number of children: Not on file   Years of education: Not on file   Highest education level: Not on file  Occupational History   Not on file  Tobacco Use   Smoking status: Never   Smokeless tobacco: Never  Vaping Use   Vaping status: Never Used  Substance and Sexual Activity   Alcohol use: Never   Drug use: Never   Sexual activity: Not on file  Other Topics Concern   Not on file  Social History Narrative   Not on file   Social Drivers of Health   Financial Resource Strain: Not on file  Food Insecurity: Medium Risk (06/18/2024)   Received from [facility]   Hunger Vital Sign    Within the past 12 months, you worried that your food would run out before you got money to buy more: Sometimes true    Within the past 12 months, the food you bought just didn't last and you didn't have money to get more. : Never true  Transportation Needs: No Transportation Needs (06/18/2024)   Received from [employer]    In the past 12 months, has lack of reliable transportation kept you from medical appointments, meetings, work or from getting things needed for daily living? : No  Physical Activity: Not on file  Stress: Not on file  Social Connections: Not on file  Intimate Partner Violence: Not on file    Family History: Family History  Problem Relation Age of  Onset   Healthy Mother    Healthy Father     Review of Systems: Denies any recent fevers, chills or changes in her health  Physical Exam: Vital Signs BP 117/69   Pulse 78   Ht 5' 9 (1.753 m)   Wt 160 lb 9.6 oz (72.8 kg)   SpO2 98%   BMI 23.72 kg/m   Physical Exam  Constitutional:      General: Not in acute distress.    Appearance: Normal appearance. Not ill-appearing.  HENT:     Head: Normocephalic and atraumatic.  Neck:     Musculoskeletal: Normal range of motion.  Cardiovascular:  Rate and Rhythm: Normal rate Pulmonary:     Effort: Pulmonary effort is normal. No respiratory distress.  Musculoskeletal: Normal range of motion.  Skin:    General: Skin is warm and dry.     Findings: No erythema or rash.  Neurological:     Mental Status: Alert and oriented to person, place, and time. Mental status is at baseline.  Psychiatric:        Mood and Affect: Mood normal.        Behavior: Behavior normal.    Assessment/Plan: The patient is scheduled for bilateral breast scar revision with Dr. Waddell.  Risks, benefits, and alternatives of procedure discussed, questions answered and consent obtained.    Smoking Status: Non-smoker; Counseling Given?  N/A Last Mammogram: N/A due to age  Caprini Score: 3; Risk Factors include: Currently taking oral contraception, and length of planned surgery. Recommendation for mechanical  prophylaxis. Encourage early ambulation.   Pictures obtained: @consult   Post-op Rx sent to pharmacy: Zofran -patient states that she has oxycodone  from previous surgery at home. discussed with her to take Tylenol  and ibuprofen  first for her pain and only take the oxycodone  if needed.  She expressed understanding.  We discussed the risks of developing blood clots with taking oral birth control.  Recommended that she hold her birth control from now until surgery and then 2 weeks after surgery.  Patient states that she did this for her breast reduction surgery  and did not have any issues.  Also recommended she hold any multivitamins or supplements at least a week before surgery.  Patient expressed understanding.  Patient was provided with the General Surgical Risk consent document and Pain Medication Agreement prior to their appointment.  They had adequate time to read through the risk consent documents and Pain Medication Agreement. We also discussed them in person together during this preop appointment. All of their questions were answered to their satisfaction.  Recommended calling if they have any further questions.  Risk consent form and Pain Medication Agreement to be scanned into patient's chart.  The consent was obtained with risks and complications reviewed which included bleeding, pain, scar, infection and the risk of anesthesia.  The patients questions were answered to the patients expressed satisfaction.    Electronically signed by: Estefana FORBES Peck, PA-C 06/22/2024 9:37 AM

## 2024-06-22 NOTE — H&P (View-Only) (Signed)
 Patient ID: Rita Ross, female    DOB: 2000/12/22, 23 y.o.   MRN: [id number]  Chief Complaint  Patient presents with   Pre-op Exam    preop:bilateral breast scar revision      ICD-10-CM   1. Scar  L90.5     2. S/P bilateral breast reduction  Z98.890        History of Present Illness: Rita Ross is a 23 y.o.  female  with a history of breast reduction.  She presents for preoperative evaluation for upcoming procedure, bilateral breast scar revision, scheduled for 06/29/2024 with Dr. Waddell.  The patient has not had problems with anesthesia.  Patient states that she has never needed a mammogram before.  She reports that her cousin had breast cancer.  She denies any history of cardiac disease.  She denies taking any blood thinners.  Patient reports she is not a smoker.  Patient does report she takes birth control.  She denies any history of miscarriages.  She denies any personal or family history of blood clots or clotting diseases.  She denies any recent surgeries, traumas or infections.  She denies any history of stroke or heart attack.  She denies any history of Crohn's disease or ulcerative colitis.  Patient does report that she has pulmonary issues, but has not had a flareup since July.  She states that she moved apartments and her pulmonologist felt her flareup was due to a new environment.  She states that she has been following up with her pulmonologist and PCP and does not report any issues since July.  She denies any history of cancer.  She denies any varicosities to her lower extremities.  She denies any recent fevers, chills or changes in her health.  Patient today did have some more questions about upcoming surgery and her incisions.  We did discuss the procedure, but I did recommend she talk with Dr. Waddell about some of the questions she had in regards to her upcoming surgery.  She expressed understanding was in agreement with this.  Summary of Previous  Visit: Patient is a 23 year old female who underwent bilateral breast reduction with Dr. Waddell on 11/21/2023.  She was then seen by Dr. Waddell on 06/07/2024.  At this visit, patient reported she is overall happy with the results, but would like to have the medial portion of both scars revised to help with the webbing.  On exam, patient had nice shape and symmetry in all the incisions were healed.  There was some medial webbing of the incisions which could be addressed with scar revision.  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 discussed with patient that she should not work while taking any pain medication.  She expressed understanding.  PMH Significant for: Allergic rhinitis, status post bilateral breast reduction   Past Medical History: Allergies: Allergies  Allergen Reactions   Amoxicillin Hives    Drowsiness   American Cockroach     Reaction unknown.   Dust Mite Extract Other (See Comments)    Pressure in ears, stuffy nose and mucus drip in throat    Molds & Smuts     Current Medications:  Current Outpatient Medications:    Azelaic Acid 15 % gel, Apply topically daily., Disp: , Rfl:    azelastine (ASTELIN) 0.1 % nasal spray, SMARTSIG:2 Spray(s) Both Nares Twice Daily PRN, Disp: , Rfl:    cetirizine (ZYRTEC) 10 MG  tablet, Take 10 mg by mouth daily., Disp: , Rfl:    fluticasone (FLONASE) 50 MCG/ACT nasal spray, Administer one spray into each nostril ONCE daily., Disp: , Rfl:    hydrocortisone 2.5 % ointment, Apply topically. Apply 2 (two) times a day as needed for rash., Disp: , Rfl:    mupirocin  ointment (BACTROBAN ) 2 %, Apply 1 Application topically 2 (two) times daily., Disp: 30 g, Rfl: 2   RETIN-A 0.025 % cream, SMARTSIG:Sparingly Topical Every Night, Disp: , Rfl:    SIMPESSE 0.15-0.03 &0.01 MG tablet, Take 1 tablet by mouth daily., Disp: , Rfl:    VENTOLIN HFA 108 (90 Base) MCG/ACT inhaler, 2 puffs every 4  (four) hours as needed for wheezing or shortness of breath., Disp: , Rfl:   Past Medical Problems: Past Medical History:  Diagnosis Date   Asthma    Breast hypertrophy     Past Surgical History: Past Surgical History:  Procedure Laterality Date   BREAST REDUCTION SURGERY Bilateral 11/21/2023   Procedure: bilateral breast reduction;  Surgeon: Waddell Leonce NOVAK, MD;  Location: [facility];  Service: Plastics;  Laterality: Bilateral;   TONSILLECTOMY     WISDOM TOOTH EXTRACTION      Social History: Social History   Socioeconomic History   Marital status: Single    Spouse name: Not on file   Number of children: Not on file   Years of education: Not on file   Highest education level: Not on file  Occupational History   Not on file  Tobacco Use   Smoking status: Never   Smokeless tobacco: Never  Vaping Use   Vaping status: Never Used  Substance and Sexual Activity   Alcohol use: Never   Drug use: Never   Sexual activity: Not on file  Other Topics Concern   Not on file  Social History Narrative   Not on file   Social Drivers of Health   Financial Resource Strain: Not on file  Food Insecurity: Medium Risk (06/18/2024)   Received from [facility]   Hunger Vital Sign    Within the past 12 months, you worried that your food would run out before you got money to buy more: Sometimes true    Within the past 12 months, the food you bought just didn't last and you didn't have money to get more. : Never true  Transportation Needs: No Transportation Needs (06/18/2024)   Received from [employer]    In the past 12 months, has lack of reliable transportation kept you from medical appointments, meetings, work or from getting things needed for daily living? : No  Physical Activity: Not on file  Stress: Not on file  Social Connections: Not on file  Intimate Partner Violence: Not on file    Family History: Family History  Problem Relation Age of  Onset   Healthy Mother    Healthy Father     Review of Systems: Denies any recent fevers, chills or changes in her health  Physical Exam: Vital Signs BP 117/69   Pulse 78   Ht 5' 9 (1.753 m)   Wt 160 lb 9.6 oz (72.8 kg)   SpO2 98%   BMI 23.72 kg/m   Physical Exam  Constitutional:      General: Not in acute distress.    Appearance: Normal appearance. Not ill-appearing.  HENT:     Head: Normocephalic and atraumatic.  Neck:     Musculoskeletal: Normal range of motion.  Cardiovascular:  Rate and Rhythm: Normal rate Pulmonary:     Effort: Pulmonary effort is normal. No respiratory distress.  Musculoskeletal: Normal range of motion.  Skin:    General: Skin is warm and dry.     Findings: No erythema or rash.  Neurological:     Mental Status: Alert and oriented to person, place, and time. Mental status is at baseline.  Psychiatric:        Mood and Affect: Mood normal.        Behavior: Behavior normal.    Assessment/Plan: The patient is scheduled for bilateral breast scar revision with Dr. Waddell.  Risks, benefits, and alternatives of procedure discussed, questions answered and consent obtained.    Smoking Status: Non-smoker; Counseling Given?  N/A Last Mammogram: N/A due to age  Caprini Score: 3; Risk Factors include: Currently taking oral contraception, and length of planned surgery. Recommendation for mechanical  prophylaxis. Encourage early ambulation.   Pictures obtained: @consult   Post-op Rx sent to pharmacy: Zofran -patient states that she has oxycodone  from previous surgery at home. discussed with her to take Tylenol  and ibuprofen  first for her pain and only take the oxycodone  if needed.  She expressed understanding.  We discussed the risks of developing blood clots with taking oral birth control.  Recommended that she hold her birth control from now until surgery and then 2 weeks after surgery.  Patient states that she did this for her breast reduction surgery  and did not have any issues.  Also recommended she hold any multivitamins or supplements at least a week before surgery.  Patient expressed understanding.  Patient was provided with the General Surgical Risk consent document and Pain Medication Agreement prior to their appointment.  They had adequate time to read through the risk consent documents and Pain Medication Agreement. We also discussed them in person together during this preop appointment. All of their questions were answered to their satisfaction.  Recommended calling if they have any further questions.  Risk consent form and Pain Medication Agreement to be scanned into patient's chart.  The consent was obtained with risks and complications reviewed which included bleeding, pain, scar, infection and the risk of anesthesia.  The patients questions were answered to the patients expressed satisfaction.    Electronically signed by: Rita FORBES Peck, PA-C 06/22/2024 9:37 AM

## 2024-06-23 ENCOUNTER — Telehealth: Payer: MEDICAID | Admitting: Plastic Surgery

## 2024-06-23 DIAGNOSIS — L905 Scar conditions and fibrosis of skin: Secondary | ICD-10-CM

## 2024-06-23 DIAGNOSIS — Z9889 Other specified postprocedural states: Secondary | ICD-10-CM

## 2024-06-23 NOTE — Progress Notes (Signed)
 Spent 15 minutes with Rita Ross today via video appointment which we transitioned to a telephone appointment due to technical difficulties with the Internet.  She had several questions regarding the scar revision.  I told her that each scar revision is individualized and I cannot tell her exactly what her scar revision will look like until I start the procedure.  I am not planning on connecting the scars in the midline however I will release as much of the webbing between the breasts as I can.  I will also try to curve the incisions along the inner portion of the breasts if possible.  She did specifically ask about the risk of nipple loss with this procedure.  I do not anticipate that there is any risk of that.  Her biggest risk for this procedure is to satisfaction with the amount of release I can achieve.  All questions were answered to her satisfaction today.  She will let me know if she wishes to proceed with surgery.

## 2024-06-24 ENCOUNTER — Encounter: Payer: Self-pay | Admitting: Plastic Surgery

## 2024-06-29 ENCOUNTER — Ambulatory Visit (HOSPITAL_BASED_OUTPATIENT_CLINIC_OR_DEPARTMENT_OTHER): Payer: MEDICAID | Admitting: Anesthesiology

## 2024-06-29 ENCOUNTER — Encounter (HOSPITAL_BASED_OUTPATIENT_CLINIC_OR_DEPARTMENT_OTHER): Payer: Self-pay | Admitting: Plastic Surgery

## 2024-06-29 ENCOUNTER — Encounter (HOSPITAL_BASED_OUTPATIENT_CLINIC_OR_DEPARTMENT_OTHER)
Admission: RE | Disposition: A | Discharge status: Home / Self Care | Payer: Self-pay | Source: Home / Self Care | Attending: Plastic Surgery

## 2024-06-29 ENCOUNTER — Other Ambulatory Visit: Payer: Self-pay

## 2024-06-29 ENCOUNTER — Ambulatory Visit (HOSPITAL_BASED_OUTPATIENT_CLINIC_OR_DEPARTMENT_OTHER)
Admission: RE | Admit: 2024-06-29 | Discharge: 2024-06-29 | Disposition: A | Discharge status: Home / Self Care | Payer: MEDICAID | Attending: Plastic Surgery | Admitting: Plastic Surgery

## 2024-06-29 DIAGNOSIS — Z7951 Long term (current) use of inhaled steroids: Secondary | ICD-10-CM | POA: Insufficient documentation

## 2024-06-29 DIAGNOSIS — Z79899 Other long term (current) drug therapy: Secondary | ICD-10-CM | POA: Diagnosis not present

## 2024-06-29 DIAGNOSIS — L905 Scar conditions and fibrosis of skin: Secondary | ICD-10-CM

## 2024-06-29 DIAGNOSIS — J45909 Unspecified asthma, uncomplicated: Secondary | ICD-10-CM | POA: Diagnosis not present

## 2024-06-29 DIAGNOSIS — Z01818 Encounter for other preprocedural examination: Secondary | ICD-10-CM

## 2024-06-29 HISTORY — PX: SCAR REVISION: SHX5285

## 2024-06-29 LAB — POCT PREGNANCY, URINE: Preg Test, Ur: NEGATIVE

## 2024-06-29 SURGERY — REVISION, SCAR
Anesthesia: General | Site: Breast | Laterality: Bilateral

## 2024-06-29 MED ORDER — FENTANYL CITRATE (PF) 100 MCG/2ML IJ SOLN
INTRAMUSCULAR | Status: AC
  Filled 2024-06-29: qty 2

## 2024-06-29 MED ORDER — ACETAMINOPHEN 500 MG PO TABS
1000.0000 mg | ORAL_TABLET | Freq: Once | ORAL | Status: AC
  Administered 2024-06-29: 1000 mg via ORAL

## 2024-06-29 MED ORDER — BUPIVACAINE-EPINEPHRINE (PF) 0.25% -1:200000 IJ SOLN
INTRAMUSCULAR | Status: AC
  Filled 2024-06-29: qty 30

## 2024-06-29 MED ORDER — OXYCODONE HCL 5 MG/5ML PO SOLN: 5.0000 mg | Freq: Once | ORAL | Status: DC | PRN

## 2024-06-29 MED ORDER — LIDOCAINE 2% (20 MG/ML) 5 ML SYRINGE
INTRAMUSCULAR | Status: AC
  Filled 2024-06-29: qty 5

## 2024-06-29 MED ORDER — KETOROLAC TROMETHAMINE 30 MG/ML IJ SOLN: 30.0000 mg | Freq: Once | INTRAMUSCULAR | Status: DC | PRN

## 2024-06-29 MED ORDER — MIDAZOLAM HCL 5 MG/5ML IJ SOLN
INTRAMUSCULAR | Status: DC | PRN
  Administered 2024-06-29: 2 mg via INTRAVENOUS

## 2024-06-29 MED ORDER — MIDAZOLAM HCL 2 MG/2ML IJ SOLN
INTRAMUSCULAR | Status: AC
  Filled 2024-06-29: qty 2

## 2024-06-29 MED ORDER — PROPOFOL 10 MG/ML IV BOLUS
INTRAVENOUS | Status: AC
  Filled 2024-06-29: qty 20

## 2024-06-29 MED ORDER — OXYCODONE HCL 5 MG PO TABS: 5.0000 mg | ORAL_TABLET | Freq: Once | ORAL | Status: DC | PRN

## 2024-06-29 MED ORDER — BUPIVACAINE-EPINEPHRINE 0.25% -1:200000 IJ SOLN
INTRAMUSCULAR | Status: DC | PRN
  Administered 2024-06-29: 15 mL

## 2024-06-29 MED ORDER — ACETAMINOPHEN 500 MG PO TABS
ORAL_TABLET | ORAL | Status: AC
  Filled 2024-06-29: qty 2

## 2024-06-29 MED ORDER — LACTATED RINGERS IV SOLN: INTRAVENOUS | Status: DC

## 2024-06-29 MED ORDER — CHLORHEXIDINE GLUCONATE CLOTH 2 % EX PADS: 6.0000 | MEDICATED_PAD | Freq: Once | CUTANEOUS | Status: DC

## 2024-06-29 MED ORDER — DEXAMETHASONE SOD PHOSPHATE PF 10 MG/ML IJ SOLN
INTRAMUSCULAR | Status: DC | PRN
  Administered 2024-06-29: 5 mg via INTRAVENOUS

## 2024-06-29 MED ORDER — PROPOFOL 10 MG/ML IV BOLUS
INTRAVENOUS | Status: DC | PRN
  Administered 2024-06-29: 200 mg via INTRAVENOUS

## 2024-06-29 MED ORDER — ONDANSETRON HCL 4 MG/2ML IJ SOLN
INTRAMUSCULAR | Status: DC | PRN
  Administered 2024-06-29: 4 mg via INTRAVENOUS

## 2024-06-29 MED ORDER — KETOROLAC TROMETHAMINE 30 MG/ML IJ SOLN
INTRAMUSCULAR | Status: DC | PRN
  Administered 2024-06-29: 30 mg via INTRAVENOUS

## 2024-06-29 MED ORDER — LIDOCAINE HCL (CARDIAC) PF 100 MG/5ML IV SOSY
PREFILLED_SYRINGE | INTRAVENOUS | Status: DC | PRN
  Administered 2024-06-29: 40 mg via INTRAVENOUS

## 2024-06-29 MED ORDER — ONDANSETRON HCL 4 MG/2ML IJ SOLN
INTRAMUSCULAR | Status: AC
  Filled 2024-06-29: qty 2

## 2024-06-29 MED ORDER — FENTANYL CITRATE (PF) 100 MCG/2ML IJ SOLN
INTRAMUSCULAR | Status: DC | PRN
  Administered 2024-06-29: 100 ug via INTRAVENOUS

## 2024-06-29 MED ORDER — 0.9 % SODIUM CHLORIDE (POUR BTL) OPTIME
TOPICAL | Status: DC | PRN
  Administered 2024-06-29: 200 mL

## 2024-06-29 MED ORDER — AMISULPRIDE (ANTIEMETIC) 5 MG/2ML IV SOLN: 10.0000 mg | Freq: Once | INTRAVENOUS | Status: DC | PRN

## 2024-06-29 MED ORDER — FENTANYL CITRATE (PF) 100 MCG/2ML IJ SOLN
25.0000 ug | INTRAMUSCULAR | Status: DC | PRN
  Administered 2024-06-29: 50 ug via INTRAVENOUS

## 2024-06-29 SURGICAL SUPPLY — 68 items
BLADE CLIPPER SURG (BLADE) IMPLANT
BLADE SURG 15 STRL LF DISP TIS (BLADE) ×1 IMPLANT
BNDG ELASTIC 2INX 5YD STR LF (GAUZE/BANDAGES/DRESSINGS) IMPLANT
BNDG GAUZE DERMACEA FLUFF 4 (GAUZE/BANDAGES/DRESSINGS) IMPLANT
CANISTER SUCT 1200ML W/VALVE (MISCELLANEOUS) IMPLANT
CLSR STERI-STRIP ANTIMIC 1/2X4 (GAUZE/BANDAGES/DRESSINGS) IMPLANT
CORD BIPOLAR FORCEPS 12FT (ELECTRODE) IMPLANT
COVER BACK TABLE 60X90IN (DRAPES) ×1 IMPLANT
COVER MAYO STAND STRL (DRAPES) ×1 IMPLANT
DERMABOND ADVANCED .7 DNX12 (GAUZE/BANDAGES/DRESSINGS) IMPLANT
DRAPE LAPAROTOMY T 102X78X121 (DRAPES) IMPLANT
DRAPE U-SHAPE 76X120 STRL (DRAPES) IMPLANT
DRESSING MEPILEX FLEX 4X4 (GAUZE/BANDAGES/DRESSINGS) IMPLANT
DRSG ADAPTIC 3X8 NADH LF (GAUZE/BANDAGES/DRESSINGS) IMPLANT
DRSG EMULSION OIL 3X3 NADH (GAUZE/BANDAGES/DRESSINGS) IMPLANT
ELECT COATED BLADE 2.86 ST (ELECTRODE) IMPLANT
ELECT NDL BLADE 2-5/6 (NEEDLE) IMPLANT
ELECT NEEDLE BLADE 2-5/6 (NEEDLE) IMPLANT
ELECTRODE REM PT RETRN 9FT PED (ELECTROSURGICAL) IMPLANT
ELECTRODE REM PT RTRN 9FT ADLT (ELECTROSURGICAL) IMPLANT
GAUZE PAD ABD 8X10 STRL (GAUZE/BANDAGES/DRESSINGS) IMPLANT
GAUZE SPONGE 2X2 STRL 8-PLY (GAUZE/BANDAGES/DRESSINGS) IMPLANT
GAUZE SPONGE 4X4 12PLY STRL LF (GAUZE/BANDAGES/DRESSINGS) IMPLANT
GAUZE STRETCH 2X75IN STRL (MISCELLANEOUS) IMPLANT
GAUZE XEROFORM 1X8 LF (GAUZE/BANDAGES/DRESSINGS) IMPLANT
GAUZE XEROFORM 5X9 LF (GAUZE/BANDAGES/DRESSINGS) IMPLANT
GLOVE BIO SURGEON STRL SZ7.5 (GLOVE) ×2 IMPLANT
GLOVE BIO SURGEON STRL SZ8 (GLOVE) ×1 IMPLANT
GLOVE BIOGEL PI IND STRL 8 (GLOVE) ×2 IMPLANT
GOWN STRL REUS W/ TWL LRG LVL3 (GOWN DISPOSABLE) ×1 IMPLANT
GOWN STRL REUS W/TWL XL LVL3 (GOWN DISPOSABLE) ×2 IMPLANT
HIBICLENS CHG 4% 4OZ BTL (MISCELLANEOUS) ×1 IMPLANT
NDL HYPO 30GX1 BEV (NEEDLE) IMPLANT
NDL PRECISIONGLIDE 27X1.5 (NEEDLE) IMPLANT
NDL SPNL 18GX3.5 QUINCKE PK (NEEDLE) IMPLANT
NEEDLE HYPO 30GX1 BEV (NEEDLE) IMPLANT
NEEDLE PRECISIONGLIDE 27X1.5 (NEEDLE) IMPLANT
NEEDLE SPNL 18GX3.5 QUINCKE PK (NEEDLE) IMPLANT
NS IRRIG 1000ML POUR BTL (IV SOLUTION) IMPLANT
PACK BASIN DAY SURGERY FS (CUSTOM PROCEDURE TRAY) ×1 IMPLANT
PENCIL SMOKE EVACUATOR (MISCELLANEOUS) ×1 IMPLANT
SHEET MEDIUM DRAPE 40X70 STRL (DRAPES) IMPLANT
SLEEVE SCD COMPRESS KNEE MED (STOCKING) IMPLANT
STAPLER SKIN PROX WIDE 3.9 (STAPLE) IMPLANT
STRIP CLOSURE SKIN 1/2X4 (GAUZE/BANDAGES/DRESSINGS) IMPLANT
STRIP SUTURE WOUND CLOSURE 1/2 (MISCELLANEOUS) IMPLANT
SUCTION TUBE FRAZIER 10FR DISP (SUCTIONS) IMPLANT
SUT CHROMIC 4 0 RB 1X27 (SUTURE) IMPLANT
SUT CHROMIC 5 0 P 3 (SUTURE) IMPLANT
SUT ETHILON 4 0 P 3 18 (SUTURE) IMPLANT
SUT ETHILON 4 0 PS 2 18 (SUTURE) IMPLANT
SUT MNCRL 6-0 UNDY P1 1X18 (SUTURE) IMPLANT
SUT MNCRL AB 3-0 PS2 18 (SUTURE) IMPLANT
SUT MNCRL AB 4-0 PS2 18 (SUTURE) IMPLANT
SUT MON AB 5-0 P3 18 (SUTURE) IMPLANT
SUT NYLON ETHILON 5-0 P-3 1X18 (SUTURE) IMPLANT
SUT PDS II 3-0 CT2 27 ABS (SUTURE) IMPLANT
SUT PROLENE 4 0 PS 2 18 (SUTURE) IMPLANT
SUT PROLENE 5 0 P 3 (SUTURE) IMPLANT
SUT VIC AB 3-0 SH 27X BRD (SUTURE) IMPLANT
SUT VIC AB 4-0 PS2 18 (SUTURE) IMPLANT
SUT VIC AB 5-0 P-3 18X BRD (SUTURE) IMPLANT
SUT VICRYL RAPIDE 4-0 (SUTURE) IMPLANT
SYR BULB EAR ULCER 3OZ GRN STR (SYRINGE) IMPLANT
SYR CONTROL 10ML LL (SYRINGE) ×1 IMPLANT
TOWEL GREEN STERILE FF (TOWEL DISPOSABLE) ×1 IMPLANT
TRAY DSU PREP LF (CUSTOM PROCEDURE TRAY) IMPLANT
TUBE CONNECTING 20X1/4 (TUBING) IMPLANT

## 2024-06-29 NOTE — Interval H&P Note (Signed)
 History and Physical Interval Note: No change in exam or indication for surgery All questions answered Marked for a scar revision with her concurrence Will proceed at her request  06/29/2024 11:15 AM  Rita Ross  has presented today for surgery, with the diagnosis of L90.5.  The various methods of treatment have been discussed with the patient and family. After consideration of risks, benefits and other options for treatment, the patient has consented to  Procedure(s) with comments: REVISION, SCAR (Bilateral) - bilateral breast scar revision as a surgical intervention.  The patient's history has been reviewed, patient examined, no change in status, stable for surgery.  I have reviewed the patient's chart and labs.  Questions were answered to the patient's satisfaction.     Leonce KATHEE [place]

## 2024-06-29 NOTE — Anesthesia Procedure Notes (Signed)
 Procedure Name: LMA Insertion Date/Time: 06/29/2024 12:04 PM  Performed by: Emilio Rock BIRCH, CRNAPre-anesthesia Checklist: Patient identified, Emergency Drugs available, Suction available and Patient being monitored Patient Re-evaluated:Patient Re-evaluated prior to induction Oxygen Delivery Method: Circle System Utilized Preoxygenation: Pre-oxygenation with 100% oxygen Induction Type: IV induction Ventilation: Mask ventilation without difficulty LMA: LMA inserted LMA Size: 4.0 Number of attempts: 1 Airway Equipment and Method: bite block Placement Confirmation: positive ETCO2 Tube secured with: Tape Dental Injury: Teeth and Oropharynx as per pre-operative assessment

## 2024-06-29 NOTE — Transfer of Care (Signed)
 Immediate Anesthesia Transfer of Care Note  Patient: Rita Ross  Procedure(s) Performed: REVISION, SCAR (Bilateral: Breast)  Patient Location: PACU  Anesthesia Type:General  Level of Consciousness: awake and patient cooperative  Airway & Oxygen Therapy: Patient Spontanous Breathing and Patient connected to nasal cannula oxygen  Post-op Assessment: Report given to RN and Post -op Vital signs reviewed and stable  Post vital signs: Reviewed and stable  Last Vitals:  Vitals Value Taken Time  BP 131/92 06/29/24 13:05  Temp 36.3 C 06/29/24 13:07  Pulse 109 06/29/24 13:07  Resp 14 06/29/24 13:07  SpO2 100 % 06/29/24 13:07  Vitals shown include unfiled device data.  Last Pain:  Vitals:   06/29/24 0859  TempSrc: Temporal  PainSc: 0-No pain      Patients Stated Pain Goal: 3 (06/29/24 0859)  Complications: No notable events documented.

## 2024-06-29 NOTE — Anesthesia Preprocedure Evaluation (Addendum)
 Anesthesia Evaluation  Patient identified by MRN, date of birth, ID band Patient awake    Reviewed: Allergy & Precautions, NPO status , Patient's Chart, lab work & pertinent test results  Airway Mallampati: II  TM Distance: >3 FB Neck ROM: Full    Dental no notable dental hx.    Pulmonary asthma    Pulmonary exam normal        Cardiovascular negative cardio ROS Normal cardiovascular exam     Neuro/Psych negative neurological ROS  negative psych ROS   GI/Hepatic negative GI ROS, Neg liver ROS,,,  Endo/Other  negative endocrine ROS    Renal/GU negative Renal ROS     Musculoskeletal negative musculoskeletal ROS (+)    Abdominal   Peds  Hematology negative hematology ROS (+)   Anesthesia Other Findings bilateral breast scar revision  Reproductive/Obstetrics Hcg negative                              Anesthesia Physical Anesthesia Plan  ASA: 2  Anesthesia Plan: General   Post-op Pain Management:    Induction: Intravenous  PONV Risk Score and Plan: 3 and Ondansetron , Dexamethasone , Midazolam  and Treatment may vary due to age or medical condition  Airway Management Planned: LMA  Additional Equipment:   Intra-op Plan:   Post-operative Plan: Extubation in OR  Informed Consent: I have reviewed the patients History and Physical, chart, labs and discussed the procedure including the risks, benefits and alternatives for the proposed anesthesia with the patient or authorized representative who has indicated his/her understanding and acceptance.     Dental advisory given  Plan Discussed with: CRNA  Anesthesia Plan Comments:          Anesthesia Quick Evaluation

## 2024-06-29 NOTE — Anesthesia Postprocedure Evaluation (Signed)
 Anesthesia Post Note  Patient: Rita Ross  Procedure(s) Performed: REVISION, SCAR (Bilateral: Breast)     Patient location during evaluation: PACU Anesthesia Type: General Level of consciousness: awake Pain management: pain level controlled Vital Signs Assessment: post-procedure vital signs reviewed and stable Respiratory status: spontaneous breathing, nonlabored ventilation and respiratory function stable Cardiovascular status: blood pressure returned to baseline and stable Postop Assessment: no apparent nausea or vomiting Anesthetic complications: no   No notable events documented.  Last Vitals:  Vitals:   06/29/24 1330 06/29/24 1348  BP: 110/63 (!) 117/58  Pulse: 84 88  Resp: 19 14  Temp:  (!) 36.4 C  SpO2: 95% 100%    Last Pain:  Vitals:   06/29/24 1348  TempSrc:   PainSc: 0-No pain                 Jacelyn Cuen P Veyda Kaufman

## 2024-06-29 NOTE — Op Note (Addendum)
 DATE OF OPERATION: 06/29/2024  LOCATION: [facility] operating Room  PREOPERATIVE DIAGNOSIS: Bilateral inframammary breast scars  POSTOPERATIVE DIAGNOSIS: Same  PROCEDURE: Revision of the medial aspect of the bilateral inframammary scars  SURGEON: Marinell Birmingham, MD  ASSISTANT: Honora Seip  EBL: 5 cc  CONDITION: Stable  COMPLICATIONS: None  INDICATION: The patient, Rita Ross, is a 23 y.o. female born on April 13, 2001, is here for treatment of adherent scars on the medial aspect of each breast following breast reduction.   PROCEDURE DETAILS:  The patient was seen prior to surgery and marked.  The IV antibiotics were given. The patient was taken to the operating room and given a general anesthetic. A standard time out was performed and all information was confirmed by those in the room. SCDs were placed.   The chest was prepped and draped in usual sterile manner.  The left inframammary fold scar was addressed first.  The prominent area that she is unhappy with was excised with an elliptical incision and a small amount of excess fat was removed.  Hemostasis was achieved with the electrocautery.  The wound was infiltrated with quarter percent Marcaine  with epinephrine.  The deep tissues were approximated with interrupted 3-0 Monocryl sutures the dermis was closed with interrupted and running 3-0 Monocryl sutures and the skin was closed with a running 4-0 Monocryl subcuticular stitch.  The entire length of the incision was 3 cm.  Attention was turned to the right breast where again the prominent portion of the scar was excised and hemostasis achieved with the electrocautery.  The deep tissues were approximated with 3-0 Monocryl sutures the dermis was closed with a 3-0 Monocryl suture and the skin closed with a running 4-0 Monocryl subcuticular stitch.  This incision measured approximately 2 cm in length.  The incisions were sealed with Dermabond and dressings were applied the patient was  awakened from anesthesia without incident transferred to the recovery room in good condition.  All instrument needle and sponge counts were reported as correct and no complications were appreciated during the procedure. The patient was allowed to wake up and taken to recovery room in stable condition at the end of the case. The family was notified at the end of the case.   The advanced practice practitioner (APP) assisted throughout the case.  The APP was essential in retraction and counter traction when needed to make the case progress smoothly.  This retraction and assistance made it possible to see the tissue plans for the procedure.  The assistance was needed for blood control, tissue re-approximation and assisted with closure of the incision site.

## 2024-06-29 NOTE — Discharge Instructions (Addendum)
 Please leave the Band-Aids in place until you are seen in clinic next week.  If they become saturated with blood, they can be gently removed and replaced.  The scar revision sites were closed with sutures and skin glue.  Please call the office if you have any questions or concerns prior to your appointment.  No Tylenol  before 3pm No Ibuprofen /NSAIDS before 8:45pm tonight   Post Anesthesia Home Care Instructions  Activity: Get plenty of rest for the remainder of the day. A responsible individual must stay with you for 24 hours following the procedure.  For the next 24 hours, DO NOT: -Drive a car -Advertising copywriter -Drink alcoholic beverages -Take any medication unless instructed by your physician -Make any legal decisions or sign important papers.  Meals: Start with liquid foods such as gelatin or soup. Progress to regular foods as tolerated. Avoid greasy, spicy, heavy foods. If nausea and/or vomiting occur, drink only clear liquids until the nausea and/or vomiting subsides. Call your physician if vomiting continues.  Special Instructions/Symptoms: Your throat may feel dry or sore from the anesthesia or the breathing tube placed in your throat during surgery. If this causes discomfort, gargle with warm salt water. The discomfort should disappear within 24 hours.  If you had a scopolamine  patch placed behind your ear for the management of post- operative nausea and/or vomiting:  1. The medication in the patch is effective for 72 hours, after which it should be removed.  Wrap patch in a tissue and discard in the trash. Wash hands thoroughly with soap and water. 2. You may remove the patch earlier than 72 hours if you experience unpleasant side effects which may include dry mouth, dizziness or visual disturbances. 3. Avoid touching the patch. Wash your hands with soap and water after contact with the patch.

## 2024-06-30 ENCOUNTER — Encounter (HOSPITAL_BASED_OUTPATIENT_CLINIC_OR_DEPARTMENT_OTHER): Payer: Self-pay | Admitting: Plastic Surgery

## 2024-07-05 ENCOUNTER — Ambulatory Visit (INDEPENDENT_AMBULATORY_CARE_PROVIDER_SITE_OTHER): Payer: MEDICAID | Admitting: Plastic Surgery

## 2024-07-05 VITALS — BP 116/84 | HR 89

## 2024-07-05 DIAGNOSIS — L905 Scar conditions and fibrosis of skin: Secondary | ICD-10-CM

## 2024-07-05 NOTE — Progress Notes (Signed)
 Rita Ross is 6 days postop from revision of the medial scars after breast reduction.  She is having no discomfort and has no complaints.  The early results show some improvement though the webbing still remains.  Begin scar massage at 2 weeks and follow-up with me in 2 weeks.

## 2024-07-20 ENCOUNTER — Encounter: Payer: MEDICAID | Admitting: Student

## 2024-07-23 ENCOUNTER — Ambulatory Visit: Payer: MEDICAID | Admitting: Student

## 2024-07-23 VITALS — BP 108/74 | HR 83

## 2024-07-23 DIAGNOSIS — L905 Scar conditions and fibrosis of skin: Secondary | ICD-10-CM

## 2024-07-23 DIAGNOSIS — Z9889 Other specified postprocedural states: Secondary | ICD-10-CM

## 2024-07-23 NOTE — Progress Notes (Signed)
 Patient is a 23 year old female who underwent bilateral breast reduction with Dr. Waddell back in March 2025.  She most recently underwent revision of the medial aspect of the bilaterally inframammary incision scars with Dr. Waddell on 06/29/2024.  She is about 3-1/2 weeks postop.  She presents to the clinic today for postoperative follow-up.  Patient was last seen in the clinic on 07/05/2024.  At this visit, patient was not having any discomfort and had no complaints.  They really results showed some improvement, though the webbing still remained.  Today, patient reports she is doing well.  She states that she has some sutures that are protruding.  She otherwise denies any issues or concerns.  She denies any fevers or chills.  She denies any drainage from the area.  Chaperone present on exam.  On exam, patient is sitting upright in no acute distress.  Incisions to the medial inframammary incisions are intact and healing well.  There were a few Monocryl sutures that were cut and removed.  Patient tolerated well.  There is still little bit of webbing noted.  There is no surrounding erythema or swelling.  No signs of infection.  Recommended that patient continue with Vaseline for the next week or 2, then she may transition to scar creams if she would like.  Discussed with the patient that I would like her to continue to massage the area.  Patient expressed understanding.  Patient to follow back up with Dr. Waddell in about 2 to 3 weeks.  Instructed her to call if she has any questions or concerns in the meantime.

## 2024-08-09 ENCOUNTER — Encounter: Payer: Self-pay | Admitting: Plastic Surgery

## 2024-08-09 ENCOUNTER — Ambulatory Visit: Payer: MEDICAID | Admitting: Plastic Surgery

## 2024-08-09 VITALS — BP 127/79 | HR 89 | Ht 63.0 in | Wt 163.0 lb

## 2024-08-09 DIAGNOSIS — Z9889 Other specified postprocedural states: Secondary | ICD-10-CM

## 2024-08-09 NOTE — Progress Notes (Signed)
 Rita Ross returns today after revision of the scars on the medial aspect of the inframammary incisions.  She has had some improvement though there is still remains a little bit of webbing in the central portion of the chest.  She noted a small opening in the incision on the right last week but this is healed.  She is encouraged to continue scar massage.  Will have her follow-up with me after the beginning of the year.

## 2024-09-13 ENCOUNTER — Encounter: Payer: Self-pay | Admitting: Plastic Surgery

## 2024-09-13 ENCOUNTER — Ambulatory Visit (INDEPENDENT_AMBULATORY_CARE_PROVIDER_SITE_OTHER): Payer: MEDICAID | Admitting: Plastic Surgery

## 2024-09-13 VITALS — BP 122/80 | HR 86 | Resp 95 | Ht 64.0 in | Wt 161.4 lb

## 2024-09-13 DIAGNOSIS — Z9889 Other specified postprocedural states: Secondary | ICD-10-CM

## 2024-09-13 DIAGNOSIS — L905 Scar conditions and fibrosis of skin: Secondary | ICD-10-CM | POA: Diagnosis not present

## 2024-09-13 NOTE — Progress Notes (Signed)
 Rita Ross returns today for evaluation of the inframammary scar revision performed on October 21.  She is doing well and is happy with results.  On examination  the area is much improved and all incisions are clean dry and intact.  Continue scar massage and may use scar tape.   Follow-up as needed.
